# Patient Record
Sex: Female | Born: 1980 | Race: Black or African American | Hispanic: No | Marital: Single | Smoking: Never smoker
Health system: Southern US, Community
[De-identification: ages and names within clinical notes are randomized; demographics above are authoritative.]

## PROBLEM LIST (undated history)

## (undated) ENCOUNTER — Inpatient Hospital Stay (HOSPITAL_COMMUNITY): Payer: Self-pay

## (undated) DIAGNOSIS — K219 Gastro-esophageal reflux disease without esophagitis: Secondary | ICD-10-CM

## (undated) DIAGNOSIS — A539 Syphilis, unspecified: Secondary | ICD-10-CM

## (undated) DIAGNOSIS — B009 Herpesviral infection, unspecified: Secondary | ICD-10-CM

## (undated) DIAGNOSIS — B999 Unspecified infectious disease: Secondary | ICD-10-CM

## (undated) DIAGNOSIS — R1110 Vomiting, unspecified: Secondary | ICD-10-CM

## (undated) HISTORY — PX: TENDON REPAIR: SHX5111

## (undated) HISTORY — PX: WISDOM TOOTH EXTRACTION: SHX21

## (undated) HISTORY — PX: BREAST LUMPECTOMY: SHX2

---
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
## 2000-07-30 ENCOUNTER — Emergency Department (HOSPITAL_COMMUNITY): Admission: EM | Admit: 2000-07-30 | Discharge: 2000-07-30 | Payer: Self-pay | Admitting: Emergency Medicine

## 2000-12-30 ENCOUNTER — Inpatient Hospital Stay (HOSPITAL_COMMUNITY): Admission: AD | Admit: 2000-12-30 | Discharge: 2000-12-30 | Payer: Self-pay | Admitting: *Deleted

## 2001-01-02 ENCOUNTER — Encounter: Payer: Self-pay | Admitting: *Deleted

## 2001-01-02 ENCOUNTER — Inpatient Hospital Stay (HOSPITAL_COMMUNITY): Admission: AD | Admit: 2001-01-02 | Discharge: 2001-01-02 | Payer: Self-pay | Admitting: *Deleted

## 2001-02-04 ENCOUNTER — Inpatient Hospital Stay (HOSPITAL_COMMUNITY): Admission: AD | Admit: 2001-02-04 | Discharge: 2001-02-04 | Payer: Self-pay | Admitting: Obstetrics

## 2001-02-25 ENCOUNTER — Inpatient Hospital Stay (HOSPITAL_COMMUNITY): Admission: AD | Admit: 2001-02-25 | Discharge: 2001-02-25 | Payer: Self-pay | Admitting: Obstetrics

## 2001-07-13 ENCOUNTER — Encounter: Payer: Self-pay | Admitting: Obstetrics

## 2001-07-13 ENCOUNTER — Ambulatory Visit (HOSPITAL_COMMUNITY): Admission: RE | Admit: 2001-07-13 | Discharge: 2001-07-13 | Payer: Self-pay | Admitting: Obstetrics

## 2001-08-16 ENCOUNTER — Inpatient Hospital Stay (HOSPITAL_COMMUNITY): Admission: AD | Admit: 2001-08-16 | Discharge: 2001-08-18 | Payer: Self-pay | Admitting: Obstetrics

## 2002-04-08 ENCOUNTER — Emergency Department (HOSPITAL_COMMUNITY): Admission: EM | Admit: 2002-04-08 | Discharge: 2002-04-08 | Payer: Self-pay | Admitting: Emergency Medicine

## 2002-04-12 ENCOUNTER — Encounter: Payer: Self-pay | Admitting: Obstetrics

## 2002-04-12 ENCOUNTER — Encounter: Admission: RE | Admit: 2002-04-12 | Discharge: 2002-04-12 | Payer: Self-pay | Admitting: Obstetrics

## 2002-07-26 ENCOUNTER — Encounter: Payer: Self-pay | Admitting: Obstetrics

## 2002-07-26 ENCOUNTER — Encounter: Admission: RE | Admit: 2002-07-26 | Discharge: 2002-07-26 | Payer: Self-pay | Admitting: Obstetrics

## 2003-01-24 ENCOUNTER — Encounter: Admission: RE | Admit: 2003-01-24 | Discharge: 2003-01-24 | Payer: Self-pay | Admitting: Family Medicine

## 2003-01-24 ENCOUNTER — Other Ambulatory Visit: Admission: RE | Admit: 2003-01-24 | Discharge: 2003-01-24 | Payer: Self-pay | Admitting: Family Medicine

## 2003-01-31 ENCOUNTER — Encounter: Admission: RE | Admit: 2003-01-31 | Discharge: 2003-01-31 | Payer: Self-pay | Admitting: Sports Medicine

## 2003-01-31 ENCOUNTER — Encounter: Payer: Self-pay | Admitting: Sports Medicine

## 2003-06-09 ENCOUNTER — Emergency Department (HOSPITAL_COMMUNITY): Admission: AD | Admit: 2003-06-09 | Discharge: 2003-06-09 | Payer: Self-pay | Admitting: Family Medicine

## 2003-09-05 ENCOUNTER — Encounter: Admission: RE | Admit: 2003-09-05 | Discharge: 2003-09-05 | Payer: Self-pay | Admitting: Sports Medicine

## 2003-09-10 ENCOUNTER — Encounter: Admission: RE | Admit: 2003-09-10 | Discharge: 2003-09-10 | Payer: Self-pay | Admitting: Family Medicine

## 2003-09-16 ENCOUNTER — Encounter: Admission: RE | Admit: 2003-09-16 | Discharge: 2003-09-16 | Payer: Self-pay | Admitting: Sports Medicine

## 2004-02-15 ENCOUNTER — Emergency Department (HOSPITAL_COMMUNITY): Admission: EM | Admit: 2004-02-15 | Discharge: 2004-02-15 | Payer: Self-pay | Admitting: Emergency Medicine

## 2004-02-17 ENCOUNTER — Ambulatory Visit: Payer: Self-pay | Admitting: Family Medicine

## 2004-03-11 ENCOUNTER — Ambulatory Visit: Payer: Self-pay | Admitting: Family Medicine

## 2004-04-02 ENCOUNTER — Ambulatory Visit: Payer: Self-pay | Admitting: Sports Medicine

## 2004-04-02 ENCOUNTER — Other Ambulatory Visit: Admission: RE | Admit: 2004-04-02 | Discharge: 2004-04-02 | Payer: Self-pay | Admitting: Family Medicine

## 2004-04-22 ENCOUNTER — Ambulatory Visit: Payer: Self-pay | Admitting: Family Medicine

## 2004-07-21 ENCOUNTER — Ambulatory Visit: Payer: Self-pay | Admitting: Family Medicine

## 2004-08-09 ENCOUNTER — Ambulatory Visit: Payer: Self-pay | Admitting: Sports Medicine

## 2004-11-05 ENCOUNTER — Ambulatory Visit: Payer: Self-pay | Admitting: Family Medicine

## 2004-12-17 ENCOUNTER — Ambulatory Visit: Payer: Self-pay | Admitting: Family Medicine

## 2005-01-27 ENCOUNTER — Ambulatory Visit: Payer: Self-pay | Admitting: Family Medicine

## 2005-01-29 ENCOUNTER — Emergency Department (HOSPITAL_COMMUNITY): Admission: EM | Admit: 2005-01-29 | Discharge: 2005-01-29 | Payer: Self-pay | Admitting: Family Medicine

## 2005-02-07 ENCOUNTER — Ambulatory Visit: Payer: Self-pay | Admitting: Sports Medicine

## 2005-02-24 ENCOUNTER — Ambulatory Visit: Payer: Self-pay | Admitting: Family Medicine

## 2005-03-07 ENCOUNTER — Ambulatory Visit: Payer: Self-pay | Admitting: Family Medicine

## 2005-05-04 ENCOUNTER — Ambulatory Visit: Payer: Self-pay | Admitting: Family Medicine

## 2005-05-29 ENCOUNTER — Encounter (INDEPENDENT_AMBULATORY_CARE_PROVIDER_SITE_OTHER): Payer: Self-pay | Admitting: *Deleted

## 2005-06-02 ENCOUNTER — Ambulatory Visit: Payer: Self-pay | Admitting: Family Medicine

## 2005-07-08 ENCOUNTER — Ambulatory Visit: Payer: Self-pay | Admitting: Sports Medicine

## 2005-07-22 ENCOUNTER — Ambulatory Visit: Payer: Self-pay | Admitting: Family Medicine

## 2005-07-27 ENCOUNTER — Ambulatory Visit: Payer: Self-pay | Admitting: Family Medicine

## 2005-07-27 ENCOUNTER — Inpatient Hospital Stay (HOSPITAL_COMMUNITY): Admission: AD | Admit: 2005-07-27 | Discharge: 2005-07-30 | Payer: Self-pay | Admitting: Gynecology

## 2005-07-27 ENCOUNTER — Ambulatory Visit: Payer: Self-pay | Admitting: Gynecology

## 2005-08-04 ENCOUNTER — Ambulatory Visit: Payer: Self-pay | Admitting: Gynecology

## 2005-08-11 ENCOUNTER — Ambulatory Visit: Payer: Self-pay | Admitting: Gynecology

## 2005-08-29 ENCOUNTER — Ambulatory Visit: Payer: Self-pay | Admitting: Family Medicine

## 2005-08-31 ENCOUNTER — Inpatient Hospital Stay (HOSPITAL_COMMUNITY): Admission: AD | Admit: 2005-08-31 | Discharge: 2005-09-01 | Payer: Self-pay | Admitting: Gynecology

## 2005-09-01 ENCOUNTER — Ambulatory Visit: Payer: Self-pay | Admitting: Family Medicine

## 2005-09-01 ENCOUNTER — Inpatient Hospital Stay (HOSPITAL_COMMUNITY): Admission: AD | Admit: 2005-09-01 | Discharge: 2005-09-04 | Payer: Self-pay | Admitting: Obstetrics & Gynecology

## 2005-09-01 ENCOUNTER — Ambulatory Visit: Payer: Self-pay | Admitting: Gynecology

## 2005-09-08 ENCOUNTER — Ambulatory Visit: Payer: Self-pay | Admitting: *Deleted

## 2005-09-11 ENCOUNTER — Ambulatory Visit: Payer: Self-pay | Admitting: Family Medicine

## 2005-09-11 ENCOUNTER — Inpatient Hospital Stay (HOSPITAL_COMMUNITY): Admission: AD | Admit: 2005-09-11 | Discharge: 2005-09-13 | Payer: Self-pay | Admitting: *Deleted

## 2005-09-26 ENCOUNTER — Inpatient Hospital Stay (HOSPITAL_COMMUNITY): Admission: AD | Admit: 2005-09-26 | Discharge: 2005-09-27 | Payer: Self-pay | Admitting: *Deleted

## 2005-09-26 ENCOUNTER — Ambulatory Visit: Payer: Self-pay | Admitting: Certified Nurse Midwife

## 2005-09-29 ENCOUNTER — Ambulatory Visit: Payer: Self-pay | Admitting: Gynecology

## 2005-10-01 ENCOUNTER — Ambulatory Visit: Payer: Self-pay | Admitting: Family Medicine

## 2005-10-01 ENCOUNTER — Inpatient Hospital Stay (HOSPITAL_COMMUNITY): Admission: AD | Admit: 2005-10-01 | Discharge: 2005-10-04 | Payer: Self-pay | Admitting: Family Medicine

## 2005-10-13 ENCOUNTER — Ambulatory Visit (HOSPITAL_COMMUNITY): Admission: RE | Admit: 2005-10-13 | Discharge: 2005-10-13 | Payer: Self-pay | Admitting: *Deleted

## 2005-10-20 ENCOUNTER — Ambulatory Visit: Payer: Self-pay | Admitting: Gynecology

## 2005-10-31 ENCOUNTER — Ambulatory Visit: Payer: Self-pay | Admitting: Family Medicine

## 2005-11-03 ENCOUNTER — Ambulatory Visit: Payer: Self-pay | Admitting: Family Medicine

## 2005-11-17 ENCOUNTER — Ambulatory Visit: Payer: Self-pay | Admitting: Gynecology

## 2005-12-08 ENCOUNTER — Ambulatory Visit: Payer: Self-pay | Admitting: Family Medicine

## 2005-12-15 ENCOUNTER — Ambulatory Visit: Payer: Self-pay | Admitting: Gynecology

## 2005-12-15 ENCOUNTER — Ambulatory Visit (HOSPITAL_COMMUNITY): Admission: RE | Admit: 2005-12-15 | Discharge: 2005-12-15 | Payer: Self-pay | Admitting: Obstetrics & Gynecology

## 2005-12-19 ENCOUNTER — Ambulatory Visit: Payer: Self-pay | Admitting: Gynecology

## 2005-12-29 ENCOUNTER — Ambulatory Visit: Payer: Self-pay | Admitting: Family Medicine

## 2006-01-12 ENCOUNTER — Ambulatory Visit: Payer: Self-pay | Admitting: Gynecology

## 2006-01-19 ENCOUNTER — Ambulatory Visit: Payer: Self-pay | Admitting: Family Medicine

## 2006-01-26 ENCOUNTER — Inpatient Hospital Stay (HOSPITAL_COMMUNITY): Admission: AD | Admit: 2006-01-26 | Discharge: 2006-01-27 | Payer: Self-pay | Admitting: Obstetrics & Gynecology

## 2006-01-27 ENCOUNTER — Ambulatory Visit: Payer: Self-pay | Admitting: Obstetrics and Gynecology

## 2006-01-27 ENCOUNTER — Inpatient Hospital Stay (HOSPITAL_COMMUNITY): Admission: AD | Admit: 2006-01-27 | Discharge: 2006-01-27 | Payer: Self-pay | Admitting: Obstetrics & Gynecology

## 2006-01-31 ENCOUNTER — Ambulatory Visit: Payer: Self-pay | Admitting: Obstetrics and Gynecology

## 2006-01-31 ENCOUNTER — Encounter (INDEPENDENT_AMBULATORY_CARE_PROVIDER_SITE_OTHER): Payer: Self-pay | Admitting: Specialist

## 2006-01-31 ENCOUNTER — Inpatient Hospital Stay (HOSPITAL_COMMUNITY): Admission: AD | Admit: 2006-01-31 | Discharge: 2006-02-03 | Payer: Self-pay | Admitting: Obstetrics and Gynecology

## 2006-03-03 ENCOUNTER — Ambulatory Visit: Payer: Self-pay | Admitting: Family Medicine

## 2006-03-24 ENCOUNTER — Ambulatory Visit: Payer: Self-pay | Admitting: Family Medicine

## 2006-05-05 ENCOUNTER — Ambulatory Visit: Payer: Self-pay | Admitting: Sports Medicine

## 2006-07-20 DIAGNOSIS — A539 Syphilis, unspecified: Secondary | ICD-10-CM

## 2006-07-20 HISTORY — DX: Syphilis, unspecified: A53.9

## 2006-07-21 ENCOUNTER — Encounter (INDEPENDENT_AMBULATORY_CARE_PROVIDER_SITE_OTHER): Payer: Self-pay | Admitting: *Deleted

## 2006-09-01 ENCOUNTER — Ambulatory Visit: Payer: Self-pay | Admitting: Family Medicine

## 2006-09-01 ENCOUNTER — Encounter (INDEPENDENT_AMBULATORY_CARE_PROVIDER_SITE_OTHER): Payer: Self-pay | Admitting: Family Medicine

## 2006-09-01 DIAGNOSIS — E669 Obesity, unspecified: Secondary | ICD-10-CM | POA: Insufficient documentation

## 2006-09-01 LAB — CONVERTED CEMR LAB
Albumin: 4.7 g/dL (ref 3.5–5.2)
BUN: 13 mg/dL (ref 6–23)
CO2: 24 meq/L (ref 19–32)
Chlamydia, DNA Probe: NEGATIVE
Chloride: 103 meq/L (ref 96–112)
Cholesterol: 141 mg/dL (ref 0–200)
GC Probe Amp, Genital: NEGATIVE
LDL Cholesterol: 74 mg/dL (ref 0–99)
Potassium: 4.3 meq/L (ref 3.5–5.3)
Total Bilirubin: 1.3 mg/dL — ABNORMAL HIGH (ref 0.3–1.2)
Total Protein: 7.9 g/dL (ref 6.0–8.3)
VLDL: 14 mg/dL (ref 0–40)

## 2006-09-03 ENCOUNTER — Encounter (INDEPENDENT_AMBULATORY_CARE_PROVIDER_SITE_OTHER): Payer: Self-pay | Admitting: Family Medicine

## 2006-10-30 ENCOUNTER — Telehealth: Payer: Self-pay | Admitting: *Deleted

## 2006-10-31 ENCOUNTER — Ambulatory Visit: Payer: Self-pay | Admitting: Family Medicine

## 2006-10-31 LAB — CONVERTED CEMR LAB: Rapid Strep: NEGATIVE

## 2007-02-26 ENCOUNTER — Telehealth: Payer: Self-pay | Admitting: *Deleted

## 2007-03-02 ENCOUNTER — Telehealth (INDEPENDENT_AMBULATORY_CARE_PROVIDER_SITE_OTHER): Payer: Self-pay | Admitting: *Deleted

## 2007-03-02 ENCOUNTER — Ambulatory Visit: Payer: Self-pay | Admitting: Family Medicine

## 2007-03-02 DIAGNOSIS — R300 Dysuria: Secondary | ICD-10-CM | POA: Insufficient documentation

## 2007-03-02 DIAGNOSIS — N760 Acute vaginitis: Secondary | ICD-10-CM | POA: Insufficient documentation

## 2007-03-02 LAB — CONVERTED CEMR LAB
Ketones, urine, test strip: NEGATIVE
Nitrite: NEGATIVE
Protein, U semiquant: NEGATIVE
Specific Gravity, Urine: >=1.03
Whiff Test: POSITIVE
pH: 6

## 2007-03-05 ENCOUNTER — Telehealth: Payer: Self-pay | Admitting: *Deleted

## 2007-03-05 ENCOUNTER — Ambulatory Visit: Payer: Self-pay | Admitting: Family Medicine

## 2007-03-20 ENCOUNTER — Encounter (INDEPENDENT_AMBULATORY_CARE_PROVIDER_SITE_OTHER): Payer: Self-pay | Admitting: *Deleted

## 2007-07-03 ENCOUNTER — Telehealth: Payer: Self-pay | Admitting: *Deleted

## 2007-07-03 ENCOUNTER — Ambulatory Visit: Payer: Self-pay | Admitting: Family Medicine

## 2007-07-03 DIAGNOSIS — A6000 Herpesviral infection of urogenital system, unspecified: Secondary | ICD-10-CM | POA: Insufficient documentation

## 2007-07-04 ENCOUNTER — Encounter (INDEPENDENT_AMBULATORY_CARE_PROVIDER_SITE_OTHER): Payer: Self-pay | Admitting: Family Medicine

## 2007-07-04 ENCOUNTER — Ambulatory Visit: Payer: Self-pay | Admitting: Family Medicine

## 2007-07-04 DIAGNOSIS — J069 Acute upper respiratory infection, unspecified: Secondary | ICD-10-CM | POA: Insufficient documentation

## 2007-07-19 ENCOUNTER — Telehealth: Payer: Self-pay | Admitting: *Deleted

## 2007-07-25 ENCOUNTER — Encounter (INDEPENDENT_AMBULATORY_CARE_PROVIDER_SITE_OTHER): Payer: Self-pay | Admitting: Family Medicine

## 2007-07-25 ENCOUNTER — Ambulatory Visit: Payer: Self-pay | Admitting: Family Medicine

## 2007-07-25 DIAGNOSIS — N898 Other specified noninflammatory disorders of vagina: Secondary | ICD-10-CM | POA: Insufficient documentation

## 2007-07-25 LAB — CONVERTED CEMR LAB
Chlamydia, DNA Probe: NEGATIVE
Whiff Test: POSITIVE

## 2007-08-16 ENCOUNTER — Encounter: Payer: Self-pay | Admitting: *Deleted

## 2007-09-12 ENCOUNTER — Encounter (INDEPENDENT_AMBULATORY_CARE_PROVIDER_SITE_OTHER): Payer: Self-pay | Admitting: Family Medicine

## 2008-05-02 ENCOUNTER — Other Ambulatory Visit: Admission: RE | Admit: 2008-05-02 | Discharge: 2008-05-02 | Payer: Self-pay | Admitting: Family Medicine

## 2010-01-08 ENCOUNTER — Emergency Department (HOSPITAL_COMMUNITY): Admission: EM | Admit: 2010-01-08 | Discharge: 2010-01-08 | Payer: Self-pay | Admitting: Emergency Medicine

## 2010-10-08 NOTE — Discharge Summary (Signed)
[name], [name]              ACCOUNT NO.:  [id number]   MEDICAL RECORD NO.:  [id number]          PATIENT TYPE:  INP   LOCATION:                                [facility]   PHYSICIAN:  Angie B. Merlene Morse, MD  DATE OF BIRTH:  Dec 06, 1980   DATE OF ADMISSION:  09/08/2005  DATE OF DISCHARGE:  09/13/2005                                 DISCHARGE SUMMARY   ADMITTING DIAGNOSES:  1.  14-week intrauterine pregnancy.  2.  Hyperemesis.  3.  Weight loss.  4.  Dehydration.   DISCHARGE DIAGNOSES:  1.  Hyperemesis, stable.  2.  Dehydration, resolved.   ADMITTING HISTORY AND PHYSICAL:  The patient is a 30 year old, G4, P1-0-2-1,  at 13-6/7 weeks by LMP consistent with a 7-week ultrasound well known to our  service with hyperemesis. At the time of her last discharge she tolerated  p.o. for three days. The discharge was on September 04, 2005, but since she has  got increasingly worse. She had home health arranged and home health noted a  5-pound weight loss since discharge. She had also been treated for a UTI at  her previous admission and had been given Macrobid for four days following  her discharge and was only able to tolerate it for three days.   PHYSICAL EXAMINATION:  Her temperature was 97.9, pulse 101, respiratory rate  20, blood pressure 121/78. On physical exam, generally a well-developed,  well-nourished female in no acute distress. Cardiovascular revealed normal  S1 and S2; no murmurs, rubs, or gallops. Chest clear to auscultation  bilaterally. Abdomen gravid with no CVA tenderness.   HOSPITAL COURSE:  The patient was admitted. She was given IV fluid  rehydration. She was started on cefotetan for her UTI. She was given  Phenergan, Zofran, methylprednisolone, Reglan, and Pepcid. The patient  improved and was tolerating a regular bland diet at the time of discharge.  Significant laboratories included sodium of 137, potassium 3.6, chloride  105, CO2 25, BUN 3, creatinine 0.5,  glucose 82, AST 15, ALT 10. UA revealed  specific gravity  greater than 1.030, moderate hemoglobin, small bilirubin,  greater than 80 ketones, 100 total protein, large leukocyte esterase (that  was a clean catch). She had a repeat urine done which was cath which showed  a moderate LE. Urine culture is pending at the time of discharge. Wet prep  revealed yeast, GC, and Chlamydia negative. The patient was given Diflucan  for her yeast infection before discharge.   DISCHARGE INSTRUCTIONS:  The patient is discharged home. She is to follow up  with high risk clinic as scheduled.   DISCHARGE MEDICATIONS:  1.  Ceftin 500 mg one p.o. b.i.d., #10, with no refills.  2.  Methylprednisolone to take as directed over a two-week taper, 4 mg      tablet dispensed with 77 given with no refills. The patient was given a      copy of the taper regimen.   The patient is instructed to follow a bland diet and home health was  arranged to continue to follow the patient  at home.           ______________________________  August Saucer. Merlene Morse, MD     ABC/MEDQ  D:  09/13/2005  T:  09/13/2005  Job:  [id number]

## 2010-10-08 NOTE — Discharge Summary (Signed)
NAMEMONZERAT, HANDLER              ACCOUNT NO.:  [id number]   MEDICAL RECORD NO.:  [id number]          PATIENT TYPE:  INP   LOCATION:                                [facility]   PHYSICIAN:  Angie B. Merlene Morse, MD  DATE OF BIRTH:  1980/09/29   DATE OF ADMISSION:  09/29/2005  DATE OF DISCHARGE:  10/04/2005                                 DISCHARGE SUMMARY   ADMITTING DIAGNOSES:  1.  A 16 and 5/7 weeks intrauterine pregnancy.  2.  Hyperemesis.  3.  Dehydration   DISCHARGE DIAGNOSES:  1.  A 16 and 5/7 weeks intrauterine pregnancy.  2.  Hyperemesis.  3.  Dehydration.   ADMITTING ATTENDING:  Dr. Shawnie Pons.   DISCHARGE ATTENDING:  Dr. Okey Dupre.   CONSULTS:  Dietary.   ADMITTING HISTORY AND PHYSICAL:  The patient is a 30 year old, G4, P1-0-3-2-  1 at 82 and 5/7 weeks with a history of hyperemesis admitted 3 times this  pregnancy for hyperemesis who reports with nausea and vomiting, and weight  loss.  The weight loss was reported by the home health nurse and she was  unable to get an IV access.  She had lost 4 pounds since May 10.   OBSTETRICAL HISTORY:  SVD x1 and TAB x1.   ALLERGIES:  NO KNOWN DRUG ALLERGIES.   GYNECOLOGICAL HISTORY:  History of Chlamydia, Trichomonas, syphilis and  herpes.   MEDICAL HISTORY:  None.   SURGERIES:  Lumpectomy of right breast and tendon repair.   PHYSICAL EXAMINATION:  VITAL SIGNS:  Temp 90 76, pulse 114, respiratory rate  is 20 and blood pressure 120/82.  GENERAL:  The patient is tearing, does not feel well and weak.   LABORATORY ON ADMISSION:  Specific gravity ranging 1.030 with ketones  greater than 80 in her UA.   HOSPITAL COURSE:  The patient was admitted.  She was given IV fluids and  dietary was consulted to help with management.  I suggested 3 snacks a day  in addition to her meals.  The patient was tolerating a brat diet at that  time of the discharge.  The patient was also started on Reglan, Phenergan  and Benadryl every 6 hours, as  well as Pepcid every 12 hours.  At the time  of discharge, the patient was tolerating p.o., looked much better and stated  that she felt much better.   LABORATORY:  Prealbumin 7.4, AST 15, ALT 10, BUN 3 and, creatinine 0.8. CBC:  WBC 6.2, hemoglobin 13.8, and platelets 205.   DISCHARGE INSTRUCTIONS:  The patient was discharged to home.  She is to  follow up a scheduled at the high risk clinic on May 31.  She was to  continue with home health.  She was to take medicines.  Pepcid 20 mg twice a  day, Phenergan 25 mg 1 every 6 hours, Benadryl 25 mg every 6 hours and  Reglan 10 mg every 6 hours.  The patient was encouraged to eat small amounts  of food throughout the day and sip on fluids throughout the day.  Home  health would be  arranged to continue after discharge.           ______________________________  August Saucer. Merlene Morse, MD     ABC/MEDQ  D:  10/04/2005  T:  10/04/2005  Job:  [id number]

## 2010-10-08 NOTE — Discharge Summary (Signed)
[name], [name]              ACCOUNT NO.:  [id number]   MEDICAL RECORD NO.:  [id number]          PATIENT TYPE:  INP   LOCATION:  9131                          [facility]ILITY:  [facility]   PHYSICIAN:  Phil D. Okey Dupre, M.D.     DATE OF BIRTH:  01/23/1981   DATE OF ADMISSION:  01/31/2006  DATE OF DISCHARGE:  02/03/2006                                 DISCHARGE SUMMARY   HISTORY OF PRESENT ILLNESS:  This is a 30 year old gravida 4, para 1-0-2-1  who was admitted at 34-1/7th weeks with dehydration.  The patient had been  followed at [facility] by Barth Kirks, M.D.  It  was noted that she had hyperemesis during the whole pregnancy with poor  weight gain.  The patient was admitted on January 31, 2006 and was found  to have a nonreactive fetal heart rate tracing and fetal tachycardia.  She  did undergo an oxytocin challenge test which was positive and it was decided  by Dr. Javier Glazier. Rose that she was to undergo a cesarean section for  nonreassuring fetal heart rate pattern.   HOSPITAL COURSE:  The patient did undergo a primary low transverse cesarean  section and was delivered of a female.  Weight was 3 pounds 15.3 ounces.  Apgar's were 6 and 9.  The patient's baby did go to the neonatal ICU.  Estimated blood loss was 400 mL.  Primary surgeon, Elinor Dodge, M.D.  Assistant Darletta Moll, M.D.   The patient had unremarkable hospital course and on postoperative day #3,  she remained afebrile.  She was eating, voiding and ambulating without  difficulty and discharged home in good condition.  Her infant did remain in  neonatal ICU.   DISCHARGE INSTRUCTIONS:  No driving or heavy lifting for two weeks.  Nothing  in the vagina for six weeks.  She is to call for a temperature of greater  than 100.2 or greater.  Her staples were removed before discharge.  Her  Rubella status was noted to be immuned.  Her blood type was A positive.   FOLLOW UP:  Note to return to the office in six  weeks.   DISCHARGE MEDICATIONS:  1. Vicodin p.r.n. for pain.  2. Ibuprofen 600 mg p.r.n. for pain.  3. Prenatal vitamin 1 p.o. daily while nursing or at least six weeks.  4. Colace 100 mg p.o. b.i.d.   FINAL DIAGNOSES:  1. A 34-1/7th week preterm pregnancy delivered.  2. Nonreassuring fetal heart rate.  3. Status post primary low transverse cesarean section.     ______________________________  Jeb Levering, C.N.M.    ______________________________  Javier Glazier. Okey Dupre, M.D.    [place]/MEDQ  D:  03/25/2006  T:  03/25/2006  Job:  [id number]

## 2010-10-08 NOTE — Op Note (Signed)
[name], [name]              ACCOUNT NO.:  [id number]   MEDICAL RECORD NO.:  [id number]          PATIENT TYPE:  INP   LOCATION:  9131                          [facility]   PHYSICIAN:  Phil D. Okey Dupre, M.D.     DATE OF BIRTH:  [birth year]   DATE OF PROCEDURE:  01/31/2006  DATE OF DISCHARGE:                                 OPERATIVE REPORT   PROCEDURE:  Low transverse cesarean section.   PREOPERATIVE DIAGNOSIS:  Nonreassuring fetal heart pattern with intractable  hyperemesis gravidarum.   POSTOPERATIVE DIAGNOSIS:  Nonreassuring fetal heart pattern with intractable  hyperemesis gravidarum.   SURGEON:  Javier Glazier. Okey Dupre, M.D.   ANESTHESIA:  Spinal.   SPECIMENS TO PATHOLOGY:  Placenta.   POSTOPERATIVE CONDITION:  Satisfactory.   ESTIMATED BLOOD LOSS:  400 mL.   REASON FOR CESAREAN SECTION:  The patient, a 30 year old gravida 4, para 1-0-  2-1, with many hospital visits and admissions for hyperemesis gravidarum  throughout this pregnancy, similar to what she had with the last pregnancy.  This evening when she came in with severe vomiting having lasted several  days with large ketones in the urine and a specific gravity of the urine at  1.030, a hematocrit of 45, we felt we had evidence of dehydration as well as  the hyperemesis.  That we corrected initially as well as we could.  During  this entire time there was very little fetal reactivity on the fetal heart  monitor and the fetus was having a tachycardia from the 160 to 180 with  variable decelerations.  We watched for several hours, this did not change,  and finally decided to do an oxytocin challenge test, which was positive.  We decided to proceed with cesarean section as follows.   OPERATIVE PROCEDURE:  Under satisfactory spinal anesthesia with the patient  in the dorsal supine position with Foley catheter in the urinary bladder,  the abdomen was prepped and draped in the usual sterile manner.  Entered  through a  transverse suprapubic incision situated 3 cm above the symphysis  pubis and extending for a total length of 16 cm.  The abdomen was entered by  layers.  On entering the peritoneal cavity, the visceral peritoneum and the  anterior surface of the uterus was opened transversely by sharp dissection  and the bladder pushed away from the lower uterine segment, which was  entered by sharp and blunt dissection.  From ROT presentation, the baby was  quite easily delivered, the cord doubly clamped, divided, the baby handed to  the pediatrician.  Specimens of blood were taken from the cord for analysis.  The placenta was spontaneously removed, the uterus explored and closed with  continuous running locked 0 Vicryl on an atraumatic needle.  The area was  observed for bleeding and none was noted.  The pelvis was irrigated with  normal saline.  The fascia was closed with a continuous running 0 Vicryl  suture on an atraumatic needle.  Subcutaneous bleeders controlled with hot  cautery.  The subcutaneous layer was irrigated with normal saline and skin  staples used  for skin edge approximation.  Dry sterile dressing was applied.  Total blood  loss during the procedure approximately 400 mL.  The patient tolerated the  procedure well, was transferred to the recovery room in satisfactory  condition.  Tape, instrument, sponge and needle count were reported correct  at the end of the procedure, and the Foley catheter was draining clear amber  urine.           ______________________________  Javier Glazier Okey Dupre, M.D.     PDR/MEDQ  D:  01/31/2006  T:  02/01/2006  Job:  [id number]

## 2010-10-08 NOTE — Discharge Summary (Signed)
[name], [name]              ACCOUNT NO.:  [id number]   MEDICAL RECORD NO.:  [id number]          PATIENT TYPE:  INP   LOCATION:  9305                          [facility]   PHYSICIAN:  Angie B. Merlene Morse, MD  DATE OF BIRTH:  [birth year]   DATE OF ADMISSION:  07/27/2005  DATE OF DISCHARGE:  07/30/2005                                 DISCHARGE SUMMARY   ADMITTING ATTENDING:  Dr. Mia Creek.   DISCHARGING ATTENDING:  Dr. Okey Dupre.   ADMITTING DIAGNOSES:  1.  Hyperemesis.  2.  7-week intrauterine pregnancy.  3.  Latent syphilis.  4.  Dehydration.  5.  Hypokalemia.   DISCHARGE DIAGNOSIS:  1.  Hyperemesis.  2.  7-week intrauterine pregnancy.  3.  Latent syphilis.  4.  Dehydration.  5.  Hypokalemia.   ADMITTING HISTORY AND PHYSICAL:  The patient is a 30 year old G38, P1-0-2-1  at 7 weeks and 2 days estimated gestational age by LMP consistent with  ultrasound, who was brought in today with greater than 2 hours history of  nausea, vomiting and unable to tolerate liquids.  She also has a history of  the diagnosis of syphilis in November 2005, treated and her titer was 1:2 in  November 2006.  Her titer now is 1:16.  The patient was orthostatic in the  clinic and had a weight loss of 5.8 pounds.   HOSPITAL COURSE:  The patient was admitted to the hospital.  She was  rehydrated with IV fluids.  She received Phenergan and Zofran as needed for  nausea and vomiting.  She was started on a steroid methyl prednisolone IV.  She was given penicillin 2.4 million units IM x1 dose, which she had  repeated q. weekly for a total of 3 doses.  Her hypokalemia was treated with  potassium and her potassium equilibrated.  The patient was given Flagyl.  While here, she was found to have BV on March 2.   SIGNIFICANT LABORATORY:  H. pylori negative.  Hepatitis A and IgM negative.  TSH low, but free T4 normal and free T3 2.2, lower end of normal is 2.3.   DISCHARGE INSTRUCTIONS:  The patient was  discharged to home in stable  condition.  She was to follow a bland diet.   MEDICATIONS:  1.  Phenergan 25 mg p.o. #60 with 2 refills.  2.  Phenergan 25 mg PR #10 with 2 refills.  3.  Zofran 8 mg #30 with 2 refills.  4.  Methyl prednisolone taper 4 mg #27 as directed.  The taper it is on      March 10 she is taking 2 tablets two times a day.  March 11:  2 tablets      in the morning, 1 at lunch and 2 at bedtime.  March 12:  2 tablets in      the morning, 1 at lunch and 1 at bedtime.  March 13:  2 in the morning      and 1 at lunch.  March 14:  2 in the morning and 1 at lunch.  March 15:  2 in the morning.  March 16:  2 in the morning.  March 17:  1 in the      morning.  March 18:  1 in the morning.   The patient is to return to the high risk clinic on March 15 at 9:15.  At  that point, she should get her second dose of benzathine penicillin 2.4  million units and she should get another dose 1 week later.           ______________________________  August Saucer. Merlene Morse, MD     ABC/MEDQ  D:  07/30/2005  T:  07/31/2005  Job:  [id number]   cc:   [facility]  [place], [place] [address]

## 2010-10-08 NOTE — Discharge Summary (Signed)
NAMEJAZZELLE, ZHANG              ACCOUNT NO.:  [id number]   MEDICAL RECORD NO.:  [id number]          PATIENT TYPE:  INP   LOCATION:  9314                          [facility]   PHYSICIAN:  Lesly Dukes, M.D. DATE OF BIRTH:  [birth year]   DATE OF ADMISSION:  09/01/2005  DATE OF DISCHARGE:  09/04/2005                                 DISCHARGE SUMMARY   ADMISSION DIAGNOSIS:  Hyperemesis at 12-2/7-week intrauterine pregnancy.   DISCHARGE DIAGNOSES:  1.  Hyperemesis, improved, at 12-5/7-week viable intrauterine pregnancy.  2.  Asymptomatic bacteruria.   HISTORY OF PRESENT ILLNESS:  The patient is a 30 year old G18, P1-0-2-1, who  presented giving a 3-week history of nausea and vomiting and has been unable  to tolerate any liquids for several hours prior to admission.  Of note, she  was admitted July 27, 2005 through the 10th also with hyperemesis at 7  weeks.  It was also noted then that she had latent syphilis, which was  treated.   SIGNIFICANT LABORATORY DATA:  Sodium 135, potassium 3.1, BUN 3, creatinine  0.6, glucose 112.  LFTs normal.  Urine culture:  E. coli 40,000 on September 01, 2005.   HOSPITAL COURSE:  The patient was started on IV steroids as well as Zofran  and Phenergan on September 01, 2005.  By September 03, 2005, she was tolerating  solid foods and had not vomited for over 24 hours.  On September 04, 2005, she  was discharged home at 12-5/7 weeks with hyperemesis improved, status post  latent syphilis treated, E. coli symptomatic bacteriuria.   DISCHARGE PLANS:  Her discharge plans includes home health visit on September 06, 2005, which had already been arranged, and she is to follow up at the  [facility] in 1 week.  Medications:  Macrobid for 4 further days.  She  was given prescriptions for Zofran and Phenergan p.o. as well as a work  excuse until she is seen at the [facility] next week.      Deirdre Christy Gentles, C.N.M.    ______________________________  Lesly Dukes, M.D.    DP/MEDQ  D:  09/04/2005  T:  09/05/2005  Job:  [id number]

## 2013-01-23 ENCOUNTER — Ambulatory Visit (INDEPENDENT_AMBULATORY_CARE_PROVIDER_SITE_OTHER): Payer: 59 | Admitting: Obstetrics

## 2013-01-23 ENCOUNTER — Encounter: Payer: Self-pay | Admitting: Obstetrics

## 2013-01-23 VITALS — BP 122/84 | Temp 98.3°F | Wt 191.0 lb

## 2013-01-23 DIAGNOSIS — Z3481 Encounter for supervision of other normal pregnancy, first trimester: Secondary | ICD-10-CM

## 2013-01-23 DIAGNOSIS — N898 Other specified noninflammatory disorders of vagina: Secondary | ICD-10-CM

## 2013-01-23 DIAGNOSIS — Z3201 Encounter for pregnancy test, result positive: Secondary | ICD-10-CM

## 2013-01-23 DIAGNOSIS — A6000 Herpesviral infection of urogenital system, unspecified: Secondary | ICD-10-CM

## 2013-01-23 MED ORDER — VALACYCLOVIR HCL 1 G PO TABS: 1000.0000 mg | ORAL_TABLET | Freq: Two times a day (BID) | ORAL | Status: DC

## 2013-01-23 NOTE — Progress Notes (Signed)
P-88   Subjective:    [name] is being seen today for her first obstetrical visit.  This is a planned pregnancy. She is at [redacted]w[redacted]d gestation. Her obstetrical history is significant for preterm delivery. Relationship with FOB: significant other, living together. Patient does intend to breast feed. Pregnancy history fully reviewed.  Menstrual History: OB History   Grav Para Term Preterm Abortions TAB SAB Ect Mult Living   6 2 1 1 3 3    2       Last Pap: 2014 Results were normal.  Menarche age: 25 Regular  Patient's last menstrual period was 12/19/2012.    The following portions of the patient's history were reviewed and updated as appropriate: allergies, current medications, past family history, past medical history, past social history, past surgical history and problem list.  Review of Systems Pertinent items are noted in HPI.    Objective:    General appearance: alert and no distress Abdomen: normal findings: soft, non-tender Pelvic: cervix normal in appearance, external genitalia normal, no adnexal masses or tenderness, no cervical motion tenderness, uterus normal size, shape, and consistency and vagina normal without discharge Extremities: extremities normal, atraumatic, no cyanosis or edema    Assessment:    Pregnancy at [redacted]w[redacted]d weeks    Plan:    Initial labs drawn. Prenatal vitamins.  Counseling provided regarding continued use of seat belts, cessation of alcohol consumption, smoking or use of illicit drugs; infection precautions i.e., influenza/TDAP immunizations, toxoplasmosis,CMV, parvovirus, listeria and varicella; workplace safety, exercise during pregnancy; routine dental care, safe medications, sexual activity, hot tubs, saunas, pools, travel, caffeine use, fish and methlymercury, potential toxins, hair treatments, varicose veins Weight gain recommendations reviewed: underweight/BMI< 18.5--> gain 28 - 40 lbs; normal weight/BMI 18.5 - 24.9--> gain 25 - 35 lbs;  overweight/BMI 25 - 29.9--> gain 15 - 25 lbs; obese/BMI >30->gain  11 - 20 lbs Problem list reviewed and updated. AFP3 discussed: Done?. Role of ultrasound in pregnancy discussed; fetal survey: Done?Marland Kitchen Amniocentesis discussed: not indicated. Follow up in 2 weeks. 50% of 20 min visit spent on counseling and coordination of care.

## 2013-01-24 ENCOUNTER — Encounter: Payer: Self-pay | Admitting: Obstetrics

## 2013-01-24 LAB — GC/CHLAMYDIA PROBE AMP: CT Probe RNA: NEGATIVE

## 2013-01-24 LAB — WET PREP BY MOLECULAR PROBE
Gardnerella vaginalis: NEGATIVE
Trichomonas vaginosis: NEGATIVE

## 2013-01-24 LAB — CULTURE, OB URINE: Colony Count: 5000

## 2013-01-30 ENCOUNTER — Encounter: Payer: Self-pay | Admitting: Obstetrics

## 2013-01-30 ENCOUNTER — Ambulatory Visit (INDEPENDENT_AMBULATORY_CARE_PROVIDER_SITE_OTHER): Payer: 59 | Admitting: Obstetrics

## 2013-01-30 VITALS — BP 120/86 | Temp 98.3°F | Wt 193.6 lb

## 2013-01-30 DIAGNOSIS — Z3481 Encounter for supervision of other normal pregnancy, first trimester: Secondary | ICD-10-CM

## 2013-01-30 DIAGNOSIS — Z3480 Encounter for supervision of other normal pregnancy, unspecified trimester: Secondary | ICD-10-CM

## 2013-01-30 DIAGNOSIS — O219 Vomiting of pregnancy, unspecified: Secondary | ICD-10-CM | POA: Insufficient documentation

## 2013-01-30 DIAGNOSIS — O210 Mild hyperemesis gravidarum: Secondary | ICD-10-CM

## 2013-01-30 LAB — POCT URINALYSIS DIPSTICK
Bilirubin, UA: NEGATIVE
Glucose, UA: NEGATIVE
Leukocytes, UA: NEGATIVE
Nitrite, UA: NEGATIVE
Spec Grav, UA: 1.02

## 2013-01-30 MED ORDER — MECLIZINE HCL 25 MG PO CHEW: CHEWABLE_TABLET | ORAL | Status: DC

## 2013-01-30 NOTE — Progress Notes (Signed)
Pulse: 91

## 2013-01-31 ENCOUNTER — Encounter: Payer: Self-pay | Admitting: Obstetrics

## 2013-02-15 ENCOUNTER — Encounter: Payer: Self-pay | Admitting: Advanced Practice Midwife

## 2013-02-27 ENCOUNTER — Encounter: Payer: Self-pay | Admitting: Obstetrics

## 2013-02-27 ENCOUNTER — Ambulatory Visit (INDEPENDENT_AMBULATORY_CARE_PROVIDER_SITE_OTHER): Payer: 59 | Admitting: Obstetrics

## 2013-02-27 VITALS — BP 120/82 | Temp 97.4°F | Wt 202.0 lb

## 2013-02-27 DIAGNOSIS — Z3480 Encounter for supervision of other normal pregnancy, unspecified trimester: Secondary | ICD-10-CM

## 2013-02-27 LAB — POCT URINALYSIS DIPSTICK
Bilirubin, UA: NEGATIVE
Blood, UA: NEGATIVE
Ketones, UA: NEGATIVE
Nitrite, UA: NEGATIVE
Protein, UA: NEGATIVE
Spec Grav, UA: 1.02
Urobilinogen, UA: NEGATIVE

## 2013-02-27 NOTE — Progress Notes (Signed)
Pulse: 85

## 2013-02-28 LAB — OBSTETRIC PANEL
Antibody Screen: NEGATIVE
Basophils Relative: 0 % (ref 0–1)
Eosinophils Absolute: 0.2 10*3/uL (ref 0.0–0.7)
Eosinophils Relative: 3 % (ref 0–5)
Lymphocytes Relative: 28 % (ref 12–46)
Lymphs Abs: 2.1 10*3/uL (ref 0.7–4.0)
MCH: 25.9 pg — ABNORMAL LOW (ref 26.0–34.0)
MCHC: 32.9 g/dL (ref 30.0–36.0)
Neutro Abs: 4.5 10*3/uL (ref 1.7–7.7)
Rh Type: POSITIVE
WBC: 7.5 10*3/uL (ref 4.0–10.5)

## 2013-02-28 LAB — HIV ANTIBODY (ROUTINE TESTING W REFLEX): HIV: NONREACTIVE

## 2013-02-28 LAB — VITAMIN D 25 HYDROXY (VIT D DEFICIENCY, FRACTURES): Vit D, 25-Hydroxy: 29 ng/mL — ABNORMAL LOW (ref 30–89)

## 2013-03-01 LAB — HEMOGLOBINOPATHY EVALUATION
Hgb A2 Quant: 2.2 % (ref 2.2–3.2)
Hgb A: 97.8 % (ref 96.8–97.8)

## 2013-03-01 LAB — CULTURE, OB URINE: Colony Count: NO GROWTH

## 2013-03-13 ENCOUNTER — Ambulatory Visit (INDEPENDENT_AMBULATORY_CARE_PROVIDER_SITE_OTHER): Payer: 59 | Admitting: Obstetrics

## 2013-03-13 VITALS — BP 122/85 | Temp 97.9°F | Wt 204.4 lb

## 2013-03-13 DIAGNOSIS — Z3481 Encounter for supervision of other normal pregnancy, first trimester: Secondary | ICD-10-CM

## 2013-03-13 DIAGNOSIS — Z3480 Encounter for supervision of other normal pregnancy, unspecified trimester: Secondary | ICD-10-CM

## 2013-03-13 LAB — POCT URINALYSIS DIPSTICK
Bilirubin, UA: NEGATIVE
Ketones, UA: NEGATIVE
Leukocytes, UA: NEGATIVE
Protein, UA: NEGATIVE
Spec Grav, UA: 1.025
Urobilinogen, UA: NEGATIVE

## 2013-03-13 NOTE — Progress Notes (Signed)
Pulse: 93

## 2013-04-11 ENCOUNTER — Ambulatory Visit (INDEPENDENT_AMBULATORY_CARE_PROVIDER_SITE_OTHER): Payer: 59 | Admitting: Obstetrics

## 2013-04-11 ENCOUNTER — Encounter: Payer: Self-pay | Admitting: Obstetrics

## 2013-04-11 VITALS — BP 128/85 | Temp 97.5°F | Wt 214.0 lb

## 2013-04-11 DIAGNOSIS — Z1389 Encounter for screening for other disorder: Secondary | ICD-10-CM

## 2013-04-11 DIAGNOSIS — Z3480 Encounter for supervision of other normal pregnancy, unspecified trimester: Secondary | ICD-10-CM

## 2013-04-11 DIAGNOSIS — Z3482 Encounter for supervision of other normal pregnancy, second trimester: Secondary | ICD-10-CM

## 2013-04-11 LAB — POCT URINALYSIS DIPSTICK
Leukocytes, UA: NEGATIVE
Nitrite, UA: NEGATIVE
Urobilinogen, UA: NEGATIVE
pH, UA: 5

## 2013-04-11 NOTE — Progress Notes (Signed)
Pulse 90   Pt states that pregnancy is going well.

## 2013-04-12 LAB — AFP, QUAD SCREEN
AFP: 34.9 IU/mL
Age Alone: 1:538 {titer}
Curr Gest Age: 16.1 wks.days
INH: 77.1 pg/mL
MoM for AFP: 1.28
MoM for INH: 0.49
Osb Risk: 1:10900 {titer}
Trisomy 18 (Edward) Syndrome Interp.: 1:6580 {titer}
uE3 Value: 0.3 ng/mL

## 2013-05-07 ENCOUNTER — Ambulatory Visit (INDEPENDENT_AMBULATORY_CARE_PROVIDER_SITE_OTHER): Payer: 59 | Admitting: Obstetrics

## 2013-05-07 ENCOUNTER — Encounter: Payer: Self-pay | Admitting: Obstetrics

## 2013-05-07 ENCOUNTER — Other Ambulatory Visit: Payer: 59

## 2013-05-07 ENCOUNTER — Other Ambulatory Visit (INDEPENDENT_AMBULATORY_CARE_PROVIDER_SITE_OTHER): Payer: 59

## 2013-05-07 VITALS — BP 127/89 | Temp 97.9°F | Wt 216.0 lb

## 2013-05-07 DIAGNOSIS — Z3482 Encounter for supervision of other normal pregnancy, second trimester: Secondary | ICD-10-CM

## 2013-05-07 DIAGNOSIS — Z3480 Encounter for supervision of other normal pregnancy, unspecified trimester: Secondary | ICD-10-CM

## 2013-05-07 DIAGNOSIS — Z1389 Encounter for screening for other disorder: Secondary | ICD-10-CM

## 2013-05-07 LAB — POCT URINALYSIS DIPSTICK
Bilirubin, UA: NEGATIVE
Blood, UA: NEGATIVE
Glucose, UA: NEGATIVE
Leukocytes, UA: NEGATIVE
pH, UA: 6

## 2013-05-07 NOTE — Progress Notes (Signed)
HR - 86 Pt in office today for routine OB visit, denies concerns at this time  Subjective:    [name] is a 32 y.o. [id number] [redacted]w[redacted]d being seen today for her obstetrical visit.  Patient reports no complaints. Fetal movement: normal.  Objective:    BP 127/89  Temp(Src) 97.9 F (36.6 C)  Wt 216 lb (97.977 kg)  LMP 12/19/2012  Physical Exam  Exam  FHT:    Uterine Size:    Presentation:       Assessment:    Pregnancy:  [id number]    Plan:    Patient Active Problem List   Diagnosis Date Noted  . Nausea and vomiting in pregnancy prior to [redacted] weeks gestation 01/30/2013  . VAGINAL DISCHARGE 07/25/2007  . URI 07/04/2007  . UNSPECIFIED GENITAL HERPES 07/03/2007  . VAGINITIS NOS 03/02/2007  . DYSURIA 03/02/2007  . OBESITY NOS 09/01/2006  . SYPHILIS, UNSPECIFIED 07/20/2006    Routine Follow up in 2 Weeks.

## 2013-05-08 ENCOUNTER — Encounter: Payer: Self-pay | Admitting: Obstetrics

## 2013-05-08 LAB — US OB DETAIL + 14 WK

## 2013-05-09 ENCOUNTER — Ambulatory Visit (HOSPITAL_COMMUNITY): Payer: Self-pay

## 2013-05-09 ENCOUNTER — Encounter: Payer: 59 | Admitting: Obstetrics

## 2013-05-23 NOTE — L&D Delivery Note (Signed)
Delivery Note At 11:28 AM a viable female was delivered via Vaginal, Spontaneous Delivery (Presentation: Left Occiput Anterior).  APGAR: , ; weight 4 lb 2.8 oz (1895 g).   Placenta status: Intact, Spontaneous.  Cord: 3 vessels with the following complications: None.  Cord pH: not done  Anesthesia: None  Episiotomy: None Lacerations: None Suture Repair: 2.0 Est. Blood Loss (mL): 200  Mom to postpartum.  Baby to Couplet care / Skin to Skin.  MARSHALL,BERNARD A 08/26/2013, 11:47 AM

## 2013-06-04 ENCOUNTER — Encounter: Payer: 59 | Admitting: Obstetrics

## 2013-06-04 ENCOUNTER — Other Ambulatory Visit: Payer: 59

## 2013-06-09 ENCOUNTER — Encounter (HOSPITAL_COMMUNITY): Payer: Self-pay | Admitting: *Deleted

## 2013-06-09 ENCOUNTER — Inpatient Hospital Stay (HOSPITAL_COMMUNITY)
Admission: AD | Admit: 2013-06-09 | Discharge: 2013-06-10 | Disposition: A | Discharge status: Home / Self Care | Payer: 59 | Source: Ambulatory Visit | Attending: Obstetrics | Admitting: Obstetrics

## 2013-06-09 DIAGNOSIS — O219 Vomiting of pregnancy, unspecified: Secondary | ICD-10-CM

## 2013-06-09 DIAGNOSIS — O212 Late vomiting of pregnancy: Secondary | ICD-10-CM | POA: Insufficient documentation

## 2013-06-09 HISTORY — DX: Gastro-esophageal reflux disease without esophagitis: K21.9

## 2013-06-09 MED ORDER — DEXTROSE IN LACTATED RINGERS 5 % IV SOLN
Freq: Once | INTRAVENOUS | Status: AC
  Administered 2013-06-09: 23:00:00 via INTRAVENOUS

## 2013-06-09 MED ORDER — PROMETHAZINE HCL 12.5 MG PO TABS: 12.5000 mg | ORAL_TABLET | Freq: Four times a day (QID) | ORAL | Status: DC | PRN

## 2013-06-09 MED ORDER — PROMETHAZINE HCL 25 MG/ML IJ SOLN
25.0000 mg | Freq: Once | INTRAMUSCULAR | Status: AC
  Administered 2013-06-09: 25 mg via INTRAVENOUS
  Filled 2013-06-09: qty 1

## 2013-06-09 NOTE — MAU Provider Note (Signed)
  History     CSN: [id number]  Arrival date and time: 06/09/13 2135   First Provider Initiated Contact with Patient 06/09/13 2230      Chief Complaint  Patient presents with  . Hyperemesis Gravidarum   HPI  [name] is a 33 y.o. [id number] at 6437w4d who presents today with nausea and vomiting. She states that she has been unable to keep anything down for about 3 days now. She has been taking Meclazine, but it is no longer working. She denies any UC, VB, LOF. She states that the baby is moving normally. She is unable to leave a urine sample at this time.   Past Medical History  Diagnosis Date  . Preterm labor   . GERD (gastroesophageal reflux disease)     Past Surgical History  Procedure Laterality Date  . Cesarean section    . Breast lumpectomy Right   . Tendon repair Right     Right Hand  . Wisdom tooth extraction      Family History  Problem Relation Age of Onset  . Cancer Maternal Grandmother     History  Substance Use Topics  . Smoking status: Never Smoker   . Smokeless tobacco: Not on file  . Alcohol Use: No    Allergies: No Known Allergies  Prescriptions prior to admission  Medication Sig Dispense Refill  . meclizine (ANTIVERT) 25 MG tablet Take 25 mg by mouth 3 (three) times daily as needed for nausea.      . Prenatal Vit-Fe Fumarate-FA (PRENATAL MULTIVITAMIN) TABS tablet Take 1 tablet by mouth daily at 12 noon.        ROS Physical Exam   Blood pressure 122/77, pulse 112, temperature 99.1 F (37.3 C), temperature source Oral, resp. rate 16, height 5\' 4"  (1.626 m), weight 95.346 kg (210 lb 3.2 oz), last menstrual period 12/19/2012, SpO2 99.00%.  Physical Exam  Nursing note and vitals reviewed. Constitutional: She is oriented to person, place, and time. She appears well-developed and well-nourished. No distress.  Cardiovascular: Normal rate.   Respiratory: Effort normal.  GI: Soft. There is no tenderness.  Neurological: She is alert and oriented  to person, place, and time.  Skin: Skin is warm and dry.  Psychiatric: She has a normal mood and affect.    MAU Course  Procedures  2357: Patient reports her nausea has improved.   Assessment and Plan   1. Nausea/vomiting in pregnancy      Medication List         meclizine 25 MG tablet  Commonly known as:  ANTIVERT  Take 25 mg by mouth 3 (three) times daily as needed for nausea.     prenatal multivitamin Tabs tablet  Take 1 tablet by mouth daily at 12 noon.     promethazine 12.5 MG tablet  Commonly known as:  PHENERGAN  Take 1 tablet (12.5 mg total) by mouth every 6 (six) hours as needed for nausea or vomiting.       Follow-up Information   Follow up with HARPER,CHARLES A, MD. (as scheduled )    Specialty:  Obstetrics and Gynecology   Contact information:   [address] [place] [address] [phone]        Tawnya CrookHogan, Arneisha Kincannon Donovan 06/09/2013, 10:32 PM

## 2013-06-09 NOTE — Discharge Instructions (Signed)
Morning Sickness °Morning sickness is when you feel sick to your stomach (nauseous) during pregnancy. This nauseous feeling may or may not come with vomiting. It often occurs in the morning but can be a problem any time of day. Morning sickness is most common during the first trimester, but it may continue throughout pregnancy. While morning sickness is unpleasant, it is usually harmless unless you develop severe and continual vomiting (hyperemesis gravidarum). This condition requires more intense treatment.  °CAUSES  °The cause of morning sickness is not completely known but seems to be related to normal hormonal changes that occur in pregnancy. °RISK FACTORS °You are at greater risk if you: °· Experienced nausea or vomiting before your pregnancy. °· Had morning sickness during a previous pregnancy. °· Are pregnant with more than one baby, such as twins. °TREATMENT  °Do not use any medicines (prescription, over-the-counter, or herbal) for morning sickness without first talking to your health care provider. Your health care provider may prescribe or recommend: °· Vitamin B6 supplements. °· Anti-nausea medicines. °· The herbal medicine ginger. °HOME CARE INSTRUCTIONS  °· Only take over-the-counter or prescription medicines as directed by your health care provider. °· Taking multivitamins before getting pregnant can prevent or decrease the severity of morning sickness in most women.   °· Eat a piece of dry toast or unsalted crackers before getting out of bed in the morning.   °· Eat five or six small meals a day.   °· Eat dry and bland foods (rice, baked potato ). Foods high in carbohydrates are often helpful.  °· Do not drink liquids with your meals. Drink liquids between meals.   °· Avoid greasy, fatty, and spicy foods.   °· Get someone to cook for you if the smell of any food causes nausea and vomiting.   °· If you feel nauseous after taking prenatal vitamins, take the vitamins at night or with a snack.  °· Snack  on protein foods (nuts, yogurt, cheese) between meals if you are hungry.   °· Eat unsweetened gelatins for desserts.   °· Wearing an acupressure wristband (worn for sea sickness) may be helpful.   °· Acupuncture may be helpful.   °· Do not smoke.   °· Get a humidifier to keep the air in your house free of odors.   °· Get plenty of fresh air. °SEEK MEDICAL CARE IF:  °· Your home remedies are not working, and you need medicine. °· You feel dizzy or lightheaded. °· You are losing weight. °SEEK IMMEDIATE MEDICAL CARE IF:  °· You have persistent and uncontrolled nausea and vomiting. °· You pass out (faint). °Document Released: 06/30/2006 Document Revised: 01/09/2013 Document Reviewed: 10/24/2012 °Exit[employer]® Patient Information ©2014 [employer]. ° °

## 2013-06-09 NOTE — MAU Note (Signed)
Pt states has hyperemesis gravidarum but states meds are no longer working. Unable to hold down food since Wednesday, and no fluids since Thursday without vomiting. Denies diarrhea. Denies fever. Denies abdominal pain/ctx/LOF/vaginal bleeding. Positive fetal movement.

## 2013-06-10 DIAGNOSIS — O212 Late vomiting of pregnancy: Secondary | ICD-10-CM | POA: Diagnosis not present

## 2013-06-11 ENCOUNTER — Ambulatory Visit (INDEPENDENT_AMBULATORY_CARE_PROVIDER_SITE_OTHER): Payer: 59 | Admitting: Obstetrics

## 2013-06-11 ENCOUNTER — Encounter (HOSPITAL_COMMUNITY): Payer: Self-pay | Admitting: *Deleted

## 2013-06-11 ENCOUNTER — Other Ambulatory Visit: Payer: 59

## 2013-06-11 ENCOUNTER — Encounter: Payer: Self-pay | Admitting: *Deleted

## 2013-06-11 ENCOUNTER — Inpatient Hospital Stay (HOSPITAL_COMMUNITY)
Admission: AD | Admit: 2013-06-11 | Discharge: 2013-06-14 | DRG: 781 | Disposition: A | Discharge status: Home / Self Care | Payer: 59 | Source: Ambulatory Visit | Attending: Obstetrics | Admitting: Obstetrics

## 2013-06-11 ENCOUNTER — Encounter: Payer: Self-pay | Admitting: Obstetrics & Gynecology

## 2013-06-11 VITALS — BP 135/87 | Temp 97.2°F | Wt 211.0 lb

## 2013-06-11 DIAGNOSIS — J069 Acute upper respiratory infection, unspecified: Secondary | ICD-10-CM | POA: Diagnosis present

## 2013-06-11 DIAGNOSIS — O212 Late vomiting of pregnancy: Principal | ICD-10-CM | POA: Diagnosis present

## 2013-06-11 DIAGNOSIS — O99891 Other specified diseases and conditions complicating pregnancy: Secondary | ICD-10-CM | POA: Diagnosis present

## 2013-06-11 DIAGNOSIS — E876 Hypokalemia: Secondary | ICD-10-CM

## 2013-06-11 DIAGNOSIS — Z3480 Encounter for supervision of other normal pregnancy, unspecified trimester: Secondary | ICD-10-CM

## 2013-06-11 DIAGNOSIS — O210 Mild hyperemesis gravidarum: Secondary | ICD-10-CM | POA: Diagnosis present

## 2013-06-11 DIAGNOSIS — O9989 Other specified diseases and conditions complicating pregnancy, childbirth and the puerperium: Secondary | ICD-10-CM

## 2013-06-11 DIAGNOSIS — R1110 Vomiting, unspecified: Secondary | ICD-10-CM | POA: Diagnosis present

## 2013-06-11 HISTORY — DX: Unspecified infectious disease: B99.9

## 2013-06-11 LAB — POCT URINALYSIS DIPSTICK
Bilirubin, UA: NEGATIVE
GLUCOSE UA: NEGATIVE
LEUKOCYTES UA: NEGATIVE
NITRITE UA: NEGATIVE
Spec Grav, UA: 1.02
Urobilinogen, UA: NEGATIVE
pH, UA: 5

## 2013-06-11 LAB — CBC WITH DIFFERENTIAL/PLATELET
Basophils Absolute: 0 10*3/uL (ref 0.0–0.1)
Basophils Relative: 0 % (ref 0–1)
EOS ABS: 0.1 10*3/uL (ref 0.0–0.7)
EOS PCT: 2 % (ref 0–5)
HCT: 32.5 % — ABNORMAL LOW (ref 36.0–46.0)
Hemoglobin: 10.9 g/dL — ABNORMAL LOW (ref 12.0–15.0)
Lymphocytes Relative: 25 % (ref 12–46)
Lymphs Abs: 1.4 10*3/uL (ref 0.7–4.0)
MCH: 27.3 pg (ref 26.0–34.0)
MCHC: 33.5 g/dL (ref 30.0–36.0)
MCV: 81.5 fL (ref 78.0–100.0)
MONOS PCT: 9 % (ref 3–12)
Monocytes Absolute: 0.5 10*3/uL (ref 0.1–1.0)
NEUTROS PCT: 64 % (ref 43–77)
Neutro Abs: 3.6 10*3/uL (ref 1.7–7.7)
PLATELETS: 159 10*3/uL (ref 150–400)
RBC: 3.99 MIL/uL (ref 3.87–5.11)
RDW: 13.4 % (ref 11.5–15.5)
WBC: 5.7 10*3/uL (ref 4.0–10.5)

## 2013-06-11 LAB — COMPREHENSIVE METABOLIC PANEL
ALT: 7 U/L (ref 0–35)
AST: 12 U/L (ref 0–37)
Albumin: 3 g/dL — ABNORMAL LOW (ref 3.5–5.2)
Alkaline Phosphatase: 50 U/L (ref 39–117)
BUN: 5 mg/dL — AB (ref 6–23)
CALCIUM: 9.2 mg/dL (ref 8.4–10.5)
CO2: 22 mEq/L (ref 19–32)
Chloride: 104 mEq/L (ref 96–112)
Creatinine, Ser: 0.61 mg/dL (ref 0.50–1.10)
GFR calc non Af Amer: >90 mL/min (ref >90)
Glucose, Bld: 82 mg/dL (ref 70–99)
Potassium: 3.4 mEq/L — ABNORMAL LOW (ref 3.7–5.3)
SODIUM: 140 meq/L (ref 137–147)
TOTAL PROTEIN: 6.4 g/dL (ref 6.0–8.3)
Total Bilirubin: 0.8 mg/dL (ref 0.3–1.2)

## 2013-06-11 LAB — TYPE AND SCREEN
ABO/RH(D): A POS
Antibody Screen: NEGATIVE

## 2013-06-11 MED ORDER — POTASSIUM CHLORIDE 10 MEQ/100ML IV SOLN
10.0000 meq | INTRAVENOUS | Status: DC
  Filled 2013-06-11 (×2): qty 100

## 2013-06-11 MED ORDER — PRENATAL MULTIVITAMIN CH: 1.0000 | ORAL_TABLET | Freq: Every day | ORAL | Status: DC

## 2013-06-11 MED ORDER — LACTATED RINGERS IV BOLUS (SEPSIS)
1000.0000 mL | Freq: Once | INTRAVENOUS | Status: AC
  Administered 2013-06-11: 1000 mL via INTRAVENOUS

## 2013-06-11 MED ORDER — PROMETHAZINE HCL 25 MG/ML IJ SOLN
25.0000 mg | Freq: Once | INTRAVENOUS | Status: AC
  Administered 2013-06-11: 25 mg via INTRAVENOUS
  Filled 2013-06-11: qty 1

## 2013-06-11 MED ORDER — POTASSIUM CHLORIDE 10 MEQ/100ML IV SOLN
10.0000 meq | INTRAVENOUS | Status: DC
  Filled 2013-06-11 (×4): qty 100

## 2013-06-11 MED ORDER — ACETAMINOPHEN 325 MG PO TABS
650.0000 mg | ORAL_TABLET | ORAL | Status: DC | PRN
  Administered 2013-06-14: 650 mg via ORAL
  Filled 2013-06-11: qty 2

## 2013-06-11 MED ORDER — ZOLPIDEM TARTRATE 5 MG PO TABS
5.0000 mg | ORAL_TABLET | Freq: Every evening | ORAL | Status: DC | PRN
  Administered 2013-06-13: 5 mg via ORAL
  Filled 2013-06-11: qty 1

## 2013-06-11 MED ORDER — ONDANSETRON HCL 4 MG/2ML IJ SOLN
4.0000 mg | Freq: Once | INTRAMUSCULAR | Status: AC
  Administered 2013-06-11: 4 mg via INTRAVENOUS
  Filled 2013-06-11: qty 2

## 2013-06-11 MED ORDER — LACTATED RINGERS IV SOLN
INTRAVENOUS | Status: DC
  Administered 2013-06-12 – 2013-06-13 (×6): via INTRAVENOUS

## 2013-06-11 MED ORDER — CALCIUM CARBONATE ANTACID 500 MG PO CHEW
2.0000 | CHEWABLE_TABLET | ORAL | Status: DC | PRN
  Administered 2013-06-12 (×3): 400 mg via ORAL
  Filled 2013-06-11 (×4): qty 1

## 2013-06-11 MED ORDER — PROMETHAZINE HCL 25 MG/ML IJ SOLN
12.5000 mg | Freq: Four times a day (QID) | INTRAMUSCULAR | Status: DC | PRN
  Administered 2013-06-11 – 2013-06-13 (×2): 12.5 mg via INTRAVENOUS
  Filled 2013-06-11 (×2): qty 1

## 2013-06-11 MED ORDER — POTASSIUM CHLORIDE 10 MEQ/100ML IV SOLN
10.0000 meq | INTRAVENOUS | Status: AC
  Administered 2013-06-11 (×2): 10 meq via INTRAVENOUS
  Filled 2013-06-11 (×2): qty 100

## 2013-06-11 MED ORDER — LACTATED RINGERS IV SOLN
Freq: Every day | INTRAVENOUS | Status: DC
  Administered 2013-06-11: 18:00:00 via INTRAVENOUS
  Filled 2013-06-11: qty 1000

## 2013-06-11 MED ORDER — DOCUSATE SODIUM 100 MG PO CAPS
100.0000 mg | ORAL_CAPSULE | Freq: Every day | ORAL | Status: DC
  Administered 2013-06-12 – 2013-06-14 (×3): 100 mg via ORAL
  Filled 2013-06-11 (×3): qty 1

## 2013-06-11 NOTE — MAU Note (Signed)
Patient states she has had nausea and vomiting for the entire pregnancy. Has gotten worse and has not been able to keep anything down since 1-15. Was seen in the office this am and sent to MAU for evaluation. Denies contractions, bleeding or leaking and reports good fetal movement.

## 2013-06-11 NOTE — Progress Notes (Signed)
Pulse: 118 Patient states she is unable to keep anything down. Meclizine and Phenergan are not working.

## 2013-06-11 NOTE — H&P (Signed)
[name] is a 33 y.o. female presenting for N/V. Maternal Medical History:  Reason for admission: Nausea.  33 yo G6 P3112.  EDC 09-25-13.  Presents with H/O nausea and vomiting, can't keep anything down.  Fetal activity: Perceived fetal activity is normal.   Last perceived fetal movement was within the past hour.    Prenatal Complications - Diabetes: none.    OB History   Grav Para Term Preterm Abortions TAB SAB Ect Mult Living   6 2 1 1 3 3    2      Past Medical History  Diagnosis Date  . Preterm labor   . GERD (gastroesophageal reflux disease)   . Infection     UTI   Past Surgical History  Procedure Laterality Date  . Cesarean section    . Breast lumpectomy Right   . Tendon repair Right     Right Hand  . Wisdom tooth extraction     Family History: family history includes Cancer in her maternal grandmother. Social History:  reports that she has never smoked. She has never used smokeless tobacco. She reports that she drinks alcohol. She reports that she uses illicit drugs (Marijuana).   Prenatal Transfer Tool  Maternal Diabetes: No Genetic Screening: Normal Maternal Ultrasounds/Referrals: Normal Fetal Ultrasounds or other Referrals:  None Maternal Substance Abuse:  No Significant Maternal Medications:  Meds include: Other:  Phenergan Significant Maternal Lab Results:  Lab values include: Other:   K+ = 3.4 Other Comments:  None  Review of Systems  Gastrointestinal: Positive for nausea.  All other systems reviewed and are negative.      Blood pressure 102/76, pulse 85, temperature 97.6 F (36.4 C), temperature source Oral, resp. rate 18, height 5\' 5"  (1.651 m), weight 210 lb 6.4 oz (95.437 kg), last menstrual period 12/19/2012, SpO2 97.00%. Maternal Exam:  Abdomen: Patient reports no abdominal tenderness.   Physical Exam  Nursing note and vitals reviewed. Constitutional: She is oriented to person, place, and time. She appears well-developed and  well-nourished.  HENT:  Head: Normocephalic and atraumatic.  Eyes: Conjunctivae are normal. Pupils are equal, round, and reactive to light.  Neck: Normal range of motion. Neck supple.  Cardiovascular: Normal rate and regular rhythm.   Respiratory: Effort normal and breath sounds normal.  GI: Soft. Bowel sounds are normal.  Neurological: She is alert and oriented to person, place, and time.  Skin: Skin is warm and dry.  Psychiatric: She has a normal mood and affect. Her behavior is normal. Judgment and thought content normal.    Prenatal labs: ABO, Rh: A/POS/-- (10/08 1632) Antibody: NEG (10/08 1632) Rubella: 1.09 (10/08 1632) RPR: NON REAC (10/08 1632)  HBsAg: NEGATIVE (10/08 1632)  HIV: NON REACTIVE (10/08 1632)  GBS:     Assessment/Plan: 24.6 weeks.  Hyperemesis.  Hypokalemia.  Admit.  IV hydration and supportive management.   HARPER,CHARLES A 06/11/2013, 2:53 PM

## 2013-06-11 NOTE — MAU Note (Signed)
Ongoing problem with n/v. No diarrhea.

## 2013-06-11 NOTE — MAU Provider Note (Signed)
CSN: [id number]     Arrival date & time 06/11/13  1010 History   None    Chief Complaint  Patient presents with  . Emesis During Pregnancy   (Consider location/radiation/quality/duration/timing/severity/associated sxs/prior Treatment) Patient is a 33 y.o. female presenting with vomiting. The history is provided by the patient.  Emesis  This is a chronic problem. The problem occurs more than 10 times per day. The problem has been gradually worsening. The emesis has an appearance of stomach contents. There has been no fever. Associated symptoms include chills, cough, a fever, headaches and sweats. Pertinent negatives include no abdominal pain, no diarrhea, no myalgias and no URI.   [name] is a 33 y.o. female @ 1658w6d gestation who presents to MAU with nausea and vomiting. She was sent from Dr. Verdell CarmineHarper's office for IV hydration and medication for nausea. Patient reports that Jan 1st she started with nausea and vomiting. First she was vomiting food but now can't keep liquids or anything down. She had been taking meclizine for nausea and it had helped until Jan. She denies diarrhea, fever or chills or other problems.   Past Medical History  Diagnosis Date  . Preterm labor   . GERD (gastroesophageal reflux disease)   . Infection     UTI   Past Surgical History  Procedure Laterality Date  . Cesarean section    . Breast lumpectomy Right   . Tendon repair Right     Right Hand  . Wisdom tooth extraction     Family History  Problem Relation Age of Onset  . Cancer Maternal Grandmother     breast   History  Substance Use Topics  . Smoking status: Never Smoker   . Smokeless tobacco: Never Used  . Alcohol Use: Yes     Comment: not currently   OB History   Grav Para Term Preterm Abortions TAB SAB Ect Mult Living   6 2 1 1 3 3    2      Review of Systems  Constitutional: Positive for fever and chills.  HENT: Negative for congestion, ear pain and sore throat.   Respiratory:  Positive for cough.   Gastrointestinal: Positive for vomiting. Negative for abdominal pain and diarrhea.  Genitourinary: Negative for dysuria, urgency, frequency and decreased urine volume.  Musculoskeletal: Negative for myalgias.  Skin: Negative for rash.  Neurological: Positive for light-headedness and headaches. Negative for syncope.  Psychiatric/Behavioral: Negative for confusion. The patient is not nervous/anxious.     Allergies  Review of patient's allergies indicates no known allergies.  Home Medications  No current outpatient prescriptions on file. BP 123/75  Pulse 111  Temp(Src) 98.6 F (37 C) (Oral)  Resp 16  Ht 5\' 5"  (1.651 m)  Wt 210 lb 6.4 oz (95.437 kg)  BMI 35.01 kg/m2  SpO2 97%  LMP 12/19/2012 Physical Exam  Nursing note and vitals reviewed. Constitutional: She is oriented to person, place, and time. No distress.  HENT:  Head: Normocephalic and atraumatic.  Eyes: Conjunctivae and EOM are normal.  Neck: Normal range of motion. Neck supple.  Cardiovascular: Tachycardia present.   Pulmonary/Chest: Effort normal. She has no wheezes. She has no rales.  Abdominal: Soft. There is no tenderness.  Gravid, consistent with dates  Musculoskeletal: Normal range of motion.  Neurological: She is alert and oriented to person, place, and time. No cranial nerve deficit.  Skin: Skin is warm and dry.  Psychiatric: She has a normal mood and affect. Her behavior is normal.  Results for orders placed during the hospital encounter of 06/11/13 (from the past 24 hour(s))  CBC WITH DIFFERENTIAL     Status: Abnormal   Collection Time    06/11/13 11:49 AM      Result Value Range   WBC 5.7  4.0 - 10.5 K/uL   RBC 3.99  3.87 - 5.11 MIL/uL   Hemoglobin 10.9 (*) 12.0 - 15.0 g/dL   HCT 92.1 (*) 19.4 - 17.4 %   MCV 81.5  78.0 - 100.0 fL   MCH 27.3  26.0 - 34.0 pg   MCHC 33.5  30.0 - 36.0 g/dL   RDW 08.1  44.8 - 18.5 %   Platelets 159  150 - 400 K/uL   Neutrophils Relative % 64  43  - 77 %   Neutro Abs 3.6  1.7 - 7.7 K/uL   Lymphocytes Relative 25  12 - 46 %   Lymphs Abs 1.4  0.7 - 4.0 K/uL   Monocytes Relative 9  3 - 12 %   Monocytes Absolute 0.5  0.1 - 1.0 K/uL   Eosinophils Relative 2  0 - 5 %   Eosinophils Absolute 0.1  0.0 - 0.7 K/uL   Basophils Relative 0  0 - 1 %   Basophils Absolute 0.0  0.0 - 0.1 K/uL  COMPREHENSIVE METABOLIC PANEL     Status: Abnormal   Collection Time    06/11/13 11:49 AM      Result Value Range   Sodium 140  137 - 147 mEq/L   Potassium 3.4 (*) 3.7 - 5.3 mEq/L   Chloride 104  96 - 112 mEq/L   CO2 22  19 - 32 mEq/L   Glucose, Bld 82  70 - 99 mg/dL   BUN 5 (*) 6 - 23 mg/dL   Creatinine, Ser 6.31  0.50 - 1.10 mg/dL   Calcium 9.2  8.4 - 49.7 mg/dL   Total Protein 6.4  6.0 - 8.3 g/dL   Albumin 3.0 (*) 3.5 - 5.2 g/dL   AST 12  0 - 37 U/L   ALT 7  0 - 35 U/L   Alkaline Phosphatase 50  39 - 117 U/L   Total Bilirubin 0.8  0.3 - 1.2 mg/dL   GFR calc non Af Amer >90  >90 mL/min   GFR calc Af Amer >90  >90 mL/min    ED Course: fetal monitor, labs, IV, zofran, phenergan  Patient continues to have nausea. Will admit for observation and give potassium and continue IVF and medication for nausea.   Procedures  MDM  33 y.o. female with hyperemesis and hypokalemia. Admission orders written. Discussed with the patient and all questioned fully answered. She agrees to plan of care.

## 2013-06-12 ENCOUNTER — Encounter: Payer: Self-pay | Admitting: Obstetrics

## 2013-06-12 DIAGNOSIS — R1110 Vomiting, unspecified: Secondary | ICD-10-CM | POA: Diagnosis present

## 2013-06-12 LAB — COMPREHENSIVE METABOLIC PANEL
ALT: 8 U/L (ref 0–35)
AST: 15 U/L (ref 0–37)
Albumin: 2.7 g/dL — ABNORMAL LOW (ref 3.5–5.2)
Alkaline Phosphatase: 49 U/L (ref 39–117)
BILIRUBIN TOTAL: 0.5 mg/dL (ref 0.3–1.2)
BUN: 4 mg/dL — ABNORMAL LOW (ref 6–23)
CHLORIDE: 106 meq/L (ref 96–112)
CO2: 24 mEq/L (ref 19–32)
CREATININE: 0.59 mg/dL (ref 0.50–1.10)
Calcium: 9.4 mg/dL (ref 8.4–10.5)
GFR calc Af Amer: >90 mL/min (ref >90)
GFR calc non Af Amer: >90 mL/min (ref >90)
Glucose, Bld: 113 mg/dL — ABNORMAL HIGH (ref 70–99)
Potassium: 3.5 mEq/L — ABNORMAL LOW (ref 3.7–5.3)
Sodium: 140 mEq/L (ref 137–147)
TOTAL PROTEIN: 6 g/dL (ref 6.0–8.3)

## 2013-06-12 MED ORDER — VITAMIN B-6 50 MG PO TABS
50.0000 mg | ORAL_TABLET | Freq: Two times a day (BID) | ORAL | Status: DC
  Administered 2013-06-12 – 2013-06-14 (×5): 50 mg via ORAL
  Filled 2013-06-12 (×5): qty 1

## 2013-06-12 MED ORDER — METOCLOPRAMIDE HCL 10 MG PO TABS
5.0000 mg | ORAL_TABLET | Freq: Three times a day (TID) | ORAL | Status: DC
  Administered 2013-06-12 – 2013-06-14 (×6): 5 mg via ORAL
  Filled 2013-06-12 (×6): qty 1

## 2013-06-12 MED ORDER — AZITHROMYCIN 500 MG PO TABS
500.0000 mg | ORAL_TABLET | Freq: Every day | ORAL | Status: AC
  Administered 2013-06-12: 500 mg via ORAL
  Filled 2013-06-12: qty 1

## 2013-06-12 MED ORDER — BOOST / RESOURCE BREEZE PO LIQD
237.0000 mL | Freq: Three times a day (TID) | ORAL | Status: DC
  Administered 2013-06-12 – 2013-06-14 (×5): 1 via ORAL
  Filled 2013-06-12 (×7): qty 1

## 2013-06-12 MED ORDER — POTASSIUM CHLORIDE CRYS ER 20 MEQ PO TBCR
20.0000 meq | EXTENDED_RELEASE_TABLET | Freq: Two times a day (BID) | ORAL | Status: DC
  Administered 2013-06-12 – 2013-06-14 (×6): 20 meq via ORAL
  Filled 2013-06-12 (×6): qty 1

## 2013-06-12 MED ORDER — DOXYLAMINE SUCCINATE (SLEEP) 25 MG PO TABS
25.0000 mg | ORAL_TABLET | Freq: Two times a day (BID) | ORAL | Status: DC
  Administered 2013-06-12 – 2013-06-14 (×5): 25 mg via ORAL
  Filled 2013-06-12 (×5): qty 1

## 2013-06-12 MED ORDER — AZITHROMYCIN 250 MG PO TABS
250.0000 mg | ORAL_TABLET | Freq: Every day | ORAL | Status: DC
  Administered 2013-06-13 – 2013-06-14 (×2): 250 mg via ORAL
  Filled 2013-06-12 (×2): qty 1

## 2013-06-12 MED ORDER — GUAIFENESIN 100 MG/5ML PO SOLN
200.0000 mg | ORAL | Status: DC | PRN
  Administered 2013-06-12 – 2013-06-14 (×3): 200 mg via ORAL
  Filled 2013-06-12: qty 10
  Filled 2013-06-12: qty 15
  Filled 2013-06-12: qty 10
  Filled 2013-06-12: qty 15
  Filled 2013-06-12: qty 10

## 2013-06-12 NOTE — Progress Notes (Signed)
Ur chart review completed.  

## 2013-06-12 NOTE — Progress Notes (Signed)
Antenatal Nutrition Assessment:  Currently  [redacted] weeks gestation, with hyperemesis. Height  65 "  Weight 214 lbs  pre-pregnancy weight 191 lbs .  Pre-pregnancy  BMI 31.9  IBW 125 lbs Total weight gain 23.lbs Weight gain goals 11-20 lbs. Pt has gained back majority of weight lost PTA Estimated needs: 19-2100 kcal/day, 73-83 grams protein/day, 2.2 liters fluid/day  Regular diet tolerated without vomiting today. Kept down a bagel. Reports n/v since Thursday. Hyperemesis with previous preg. This preg has been able to control vomiting with medications allowing weight gain up until last week.  Reviewed diet for hyperemesis with pt, written instructions provided. Changed diet order to antenatal regular and encourged ordering of snacks along with bland low fat menu items. Pt willing to trial Resource, ordered this TID. Can be mixed with ginger ale or juice.   Current diet prescription will provide for increased needs.   nutrition related labs.: Bun low indicative of  recent poor po intake CMP     Component Value Date/Time   NA 140 06/12/2013 1205   K 3.5* 06/12/2013 1205   CL 106 06/12/2013 1205   CO2 24 06/12/2013 1205   GLUCOSE 113* 06/12/2013 1205   BUN 4* 06/12/2013 1205   CREATININE 0.59 06/12/2013 1205   CALCIUM 9.4 06/12/2013 1205   PROT 6.0 06/12/2013 1205   ALBUMIN 2.7* 06/12/2013 1205   AST 15 06/12/2013 1205   ALT 8 06/12/2013 1205   ALKPHOS 49 06/12/2013 1205   BILITOT 0.5 06/12/2013 1205   GFRNONAA >90 06/12/2013 1205   GFRAA >90 06/12/2013 1205     Nutrition Dx: Increased nutrient needs r/t pregnancy and fetal growth requirements aeb [redacted] weeks gestation.   Educational needs assessed   [name] M.Odis LusterEd. R.D. LDN Neonatal Nutrition Support Specialist Pager [phone]

## 2013-06-12 NOTE — Progress Notes (Signed)
Patient ID: [name], female   DOB: Oct 09, 1980, 33 y.o.   MRN: [id number] Hospital Day: 2  S: Less nausea.  O: Blood pressure 107/78, pulse 67, temperature 97.8 F (36.6 C), temperature source Oral, resp. rate 18, height 5\' 5"  (1.651 m), weight 214 lb (97.07 kg), last menstrual period 12/19/2012, SpO2 97.00%.   WUJ:WJXBJYNWFHT:Baseline: 150 bpm Toco: None SVE:   A/P- 33 y.o. admitted with:  N/V and can't keep anything down.  Hypokalemia.  Stable.  Continue supportive management.  Present on Admission:  . Hyperemesis arising during pregnancy  Pregnancy Complications: N/V  Preterm labor management: IV D5LR started Dating:  3915w0d PNL Needed:  K+ FWB:  good PTL:  None

## 2013-06-13 NOTE — Progress Notes (Signed)
Monitors applied

## 2013-06-13 NOTE — Progress Notes (Signed)
Patient ID: [name], female   DOB: [birth year], 33 y.o.   MRN: [id number] Hospital Day: 3  S: Less nausea.  Tolerating some diet.  O: Blood pressure 84/49, pulse 78, temperature 98.4 F (36.9 C), temperature source Oral, resp. rate 18, height 5\' 5"  (1.651 m), weight 214 lb (97.07 kg), last menstrual period 12/19/2012, SpO2 97.00%.   VHQ:IONGEXBMFHT:Baseline: 150 bpm Toco: None SVE:   A/P- 33 y.o. admitted with:  N/V.  Responding well to therapy.  Discharge planning for tomorrow.  Present on Admission:  . Hyperemesis arising during pregnancy . Hyperemesis  Pregnancy Complications: Hyperemesis  Preterm labor management: IV D5LR started Dating:  2928w1d PNL Needed:  none FWB:  good PTL:  none

## 2013-06-14 LAB — COMPREHENSIVE METABOLIC PANEL
ALK PHOS: 50 U/L (ref 39–117)
ALT: 11 U/L (ref 0–35)
AST: 18 U/L (ref 0–37)
Albumin: 2.7 g/dL — ABNORMAL LOW (ref 3.5–5.2)
BILIRUBIN TOTAL: 0.3 mg/dL (ref 0.3–1.2)
BUN: 5 mg/dL — ABNORMAL LOW (ref 6–23)
CHLORIDE: 99 meq/L (ref 96–112)
CO2: 24 meq/L (ref 19–32)
Calcium: 9.6 mg/dL (ref 8.4–10.5)
Creatinine, Ser: 0.5 mg/dL (ref 0.50–1.10)
GFR calc non Af Amer: >90 mL/min (ref >90)
GLUCOSE: 94 mg/dL (ref 70–99)
POTASSIUM: 3.7 meq/L (ref 3.7–5.3)
Sodium: 137 mEq/L (ref 137–147)
TOTAL PROTEIN: 6.3 g/dL (ref 6.0–8.3)

## 2013-06-14 MED ORDER — DSS 100 MG PO CAPS: 100.0000 mg | ORAL_CAPSULE | Freq: Two times a day (BID) | ORAL | Status: DC | PRN

## 2013-06-14 MED ORDER — BOOST / RESOURCE BREEZE PO LIQD: 237.0000 mL | Freq: Three times a day (TID) | ORAL | Status: DC

## 2013-06-14 MED ORDER — DOXYLAMINE SUCCINATE (SLEEP) 25 MG PO TABS: 25.0000 mg | ORAL_TABLET | Freq: Three times a day (TID) | ORAL | Status: DC

## 2013-06-14 MED ORDER — METOCLOPRAMIDE HCL 5 MG PO TABS: 5.0000 mg | ORAL_TABLET | Freq: Three times a day (TID) | ORAL | Status: DC

## 2013-06-14 MED ORDER — AZITHROMYCIN 250 MG PO TABS
ORAL_TABLET | ORAL | Status: AC
Start: 2013-06-14 — End: 2013-06-16

## 2013-06-14 MED ORDER — PYRIDOXINE HCL 50 MG PO TABS: 50.0000 mg | ORAL_TABLET | Freq: Two times a day (BID) | ORAL | Status: DC

## 2013-06-14 MED ORDER — PROMETHAZINE HCL 12.5 MG PO TABS: 12.5000 mg | ORAL_TABLET | Freq: Four times a day (QID) | ORAL | Status: DC | PRN

## 2013-06-14 NOTE — Discharge Summary (Signed)
Physician Discharge Summary  Patient ID: [name] MRN: [id number] DOB/AGE: [birth year] 33 y.o.  Admit date: 06/11/2013 Discharge date: 06/14/2013  Admission Diagnoses:  Discharge Diagnoses:  Active Problems:   Hyperemesis arising during pregnancy   Hyperemesis   Discharged Condition: good  Hospital Course: Stable, improvement of hyperemesis and URI  Consults: None  Significant Diagnostic Studies: labs: low potassium  Treatments: IV hydration and antinausea medication  Discharge Exam: Blood pressure 108/67, pulse 75, temperature 98.8 F (37.1 C), temperature source Oral, resp. rate 18, height 5\' 5"  (1.651 m), weight 220 lb 1.6 oz (99.837 kg), last menstrual period 12/19/2012, SpO2 97.00%. General appearance: alert and cooperative Head: Normocephalic, without obvious abnormality, atraumatic GI: soft, non-tender; bowel sounds normal; no masses,  no organomegaly Neurologic: Grossly normal  Disposition: 01-Home or Self Care  Patient to f/u in clinic next week. Extensively reviewed diet. Encouraged diet and hydration to prevent constipation.  Discharge Orders   Future Appointments Provider Department Dept Phone   06/25/2013 9:00 AM [facility] [phone]   06/25/2013 11:00 AM Brock Badharles A Harper, [facility] [phone]   Future Orders Complete By Expires   Comprehensive metabolic panel  As directed    Questions:     Has the patient fasted?:     Discharge activity:  No Restrictions  As directed    Discharge diet:  No restrictions  As directed    Discharge instructions  As directed    Comments:     Return to clinic next week   Fetal Kick Count:  Lie on our left side for one hour after a meal, and count the number of times your baby kicks.  If it is less than 5 times, get up, move around and drink some juice.  Repeat the test 30 minutes later.  If it is still less than 5 kicks in an hour, notify your doctor.  As directed    Notify  physician for a general feeling that "something is not right"  As directed    Notify physician for increase or change in vaginal discharge  As directed    Notify physician for intestinal cramps, with or without diarrhea, sometimes described as "gas pain"  As directed    Notify physician for leaking of fluid  As directed    Notify physician for low, dull backache, unrelieved by heat or Tylenol  As directed    Notify physician for menstrual like cramps  As directed    Notify physician for pelvic pressure  As directed    Notify physician for uterine contractions.  These may be painless and feel like the uterus is tightening or the baby is  "balling up"  As directed    Notify physician for vaginal bleeding  As directed    PRETERM LABOR:  Includes any of the follwing symptoms that occur between 20 - [redacted] weeks gestation.  If these symptoms are not stopped, preterm labor can result in preterm delivery, placing your baby at risk  As directed        Medication List    STOP taking these medications       guaifenesin 100 MG/5ML syrup  Commonly known as:  ROBITUSSIN      TAKE these medications       azithromycin 250 MG tablet  Commonly known as:  ZITHROMAX  Take last 2 doses, Saturday and Sunday     doxylamine (Sleep) 25 MG tablet  Commonly known as:  UNISOM  Take 1 tablet (  25 mg total) by mouth 3 (three) times daily.     DSS 100 MG Caps  Take 100 mg by mouth 2 (two) times daily as needed for mild constipation.     feeding supplement (RESOURCE BREEZE) Liqd  Take 1 Container by mouth 3 (three) times daily with meals.     metoCLOPramide 5 MG tablet  Commonly known as:  REGLAN  Take 1 tablet (5 mg total) by mouth 3 (three) times daily before meals.     promethazine 12.5 MG tablet  Commonly known as:  PHENERGAN  Take 1 tablet (12.5 mg total) by mouth every 6 (six) hours as needed for nausea or vomiting.     pyridOXINE 50 MG tablet  Commonly known as:  B-6  Take 1 tablet (50 mg total) by  mouth 2 (two) times daily.         SignedWilson Singer, Aracelie Addis 06/14/2013, 8:39 AM

## 2013-06-14 NOTE — Progress Notes (Addendum)
Given warm heel pack without much relief.

## 2013-06-14 NOTE — Discharge Instructions (Signed)
Hyperemesis Gravidarum  Hyperemesis gravidarum is a severe form of nausea and vomiting that happens during pregnancy. Hyperemesis is worse than morning sickness. It may cause you to have nausea or vomiting all day for many days. It may keep you from eating and drinking enough food and liquids. Hyperemesis usually occurs during the first half (the first 20 weeks) of pregnancy. It often goes away once a woman is in her second half of pregnancy. However, sometimes hyperemesis continues through an entire pregnancy.   CAUSES   The cause of this condition is not completely known but is thought to be related to changes in the body's hormones when pregnant. It could be from the high level of the pregnancy hormone or an increase in estrogen in the body.   SIGNS AND SYMPTOMS   · Severe nausea and vomiting.  · Nausea that does not go away.  · Vomiting that does not allow you to keep any food down.  · Weight loss and body fluid loss (dehydration).  · Having no desire to eat or not liking food you have previously enjoyed.  DIAGNOSIS   Your health care provider will do a physical exam and ask you about your symptoms. He or she may also order blood tests and urine tests to make sure something else is not causing the problem.   TREATMENT   You may only need medicine to control the problem. If medicines do not control the nausea and vomiting, you will be treated in the hospital to prevent dehydration, increased acid in the blood (acidosis), weight loss, and changes in the electrolytes in your body that may harm the unborn baby (fetus). You may need IV fluids.   HOME CARE INSTRUCTIONS   · Only take over-the-counter or prescription medicines as directed by your health care provider.  · Try eating a couple of dry crackers or toast in the morning before getting out of bed.  · Avoid foods and smells that upset your stomach.  · Avoid fatty and spicy foods.  · Eat 5 6 small meals a day.  · Do not drink when eating meals. Drink between  meals.  · For snacks, eat high-protein foods, such as cheese.  · Eat or suck on things that have ginger in them. Ginger helps nausea.  · Avoid food preparation. The smell of food can spoil your appetite.  · Avoid iron pills and iron in your multivitamins until after 3 4 months of being pregnant. However, consult with your health care provider before stopping any prescribed iron pills.  SEEK MEDICAL CARE IF:   · Your abdominal pain increases.  · You have a severe headache.  · You have vision problems.  · You are losing weight.  SEEK IMMEDIATE MEDICAL CARE IF:   · You are unable to keep fluids down.  · You vomit blood.  · You have constant nausea and vomiting.  · You have excessive weakness.  · You have extreme thirst.  · You have dizziness or fainting.  · You have a fever or persistent symptoms for more than 2 3 days.  · You have a fever and your symptoms suddenly get worse.  MAKE SURE YOU:   · Understand these instructions.  · Will watch your condition.  · Will get help right away if you are not doing well or get worse.  Document Released: 05/09/2005 Document Revised: 02/27/2013 Document Reviewed: 12/19/2012  ExitCare® Patient Information ©2014 [employer].

## 2013-06-14 NOTE — Progress Notes (Signed)
Ice given for hand discomfort.

## 2013-06-14 NOTE — Progress Notes (Signed)
Patient states understanding of diagnosis and hospital admission.  She states she understands her discharge instructions.  Fetal kick counts discussed along with diet and signs and symptoms of pre term labor.

## 2013-06-25 ENCOUNTER — Other Ambulatory Visit: Payer: 59

## 2013-06-25 ENCOUNTER — Encounter: Payer: Self-pay | Admitting: Obstetrics

## 2013-06-25 ENCOUNTER — Ambulatory Visit (INDEPENDENT_AMBULATORY_CARE_PROVIDER_SITE_OTHER): Payer: 59 | Admitting: Obstetrics

## 2013-06-25 VITALS — BP 109/74 | Temp 98.8°F | Wt 212.0 lb

## 2013-06-25 DIAGNOSIS — Z3480 Encounter for supervision of other normal pregnancy, unspecified trimester: Secondary | ICD-10-CM

## 2013-06-25 LAB — CBC
HEMATOCRIT: 36.2 % (ref 36.0–46.0)
Hemoglobin: 12 g/dL (ref 12.0–15.0)
MCH: 27.9 pg (ref 26.0–34.0)
MCHC: 33.1 g/dL (ref 30.0–36.0)
MCV: 84.2 fL (ref 78.0–100.0)
Platelets: 196 10*3/uL (ref 150–400)
RBC: 4.3 MIL/uL (ref 3.87–5.11)
RDW: 15 % (ref 11.5–15.5)
WBC: 6.6 10*3/uL (ref 4.0–10.5)

## 2013-06-25 NOTE — Progress Notes (Signed)
Pulse: 111 Patient states she is still unable to keep food down.

## 2013-06-26 LAB — GLUCOSE TOLERANCE, 2 HOURS W/ 1HR
Glucose, 1 hour: 113 mg/dL (ref 70–170)
Glucose, 2 hour: 94 mg/dL (ref 70–139)
Glucose, Fasting: 72 mg/dL (ref 70–99)

## 2013-06-26 LAB — RPR

## 2013-06-26 LAB — HIV ANTIBODY (ROUTINE TESTING W REFLEX): HIV: NONREACTIVE

## 2013-07-01 ENCOUNTER — Encounter: Payer: Self-pay | Admitting: Obstetrics

## 2013-07-03 ENCOUNTER — Encounter: Payer: Self-pay | Admitting: Obstetrics

## 2013-07-10 ENCOUNTER — Ambulatory Visit (INDEPENDENT_AMBULATORY_CARE_PROVIDER_SITE_OTHER): Payer: 59 | Admitting: Obstetrics

## 2013-07-10 ENCOUNTER — Encounter: Payer: Self-pay | Admitting: Obstetrics

## 2013-07-10 VITALS — BP 117/81 | Temp 97.3°F | Wt 212.0 lb

## 2013-07-10 DIAGNOSIS — R112 Nausea with vomiting, unspecified: Secondary | ICD-10-CM | POA: Insufficient documentation

## 2013-07-10 DIAGNOSIS — J029 Acute pharyngitis, unspecified: Secondary | ICD-10-CM | POA: Insufficient documentation

## 2013-07-10 DIAGNOSIS — Z1389 Encounter for screening for other disorder: Secondary | ICD-10-CM

## 2013-07-10 DIAGNOSIS — Z3480 Encounter for supervision of other normal pregnancy, unspecified trimester: Secondary | ICD-10-CM

## 2013-07-10 LAB — POCT URINALYSIS DIPSTICK
BILIRUBIN UA: NEGATIVE
Blood, UA: NEGATIVE
Glucose, UA: NEGATIVE
LEUKOCYTES UA: NEGATIVE
Nitrite, UA: NEGATIVE
PH UA: 5
Protein, UA: NEGATIVE
Spec Grav, UA: 1.02
Urobilinogen, UA: NEGATIVE

## 2013-07-10 MED ORDER — AZITHROMYCIN 250 MG PO TABS: ORAL_TABLET | ORAL | Status: DC

## 2013-07-10 NOTE — Progress Notes (Signed)
Pulse: 102

## 2013-07-10 NOTE — Progress Notes (Signed)
Continues to have nausea and vomiting.  Dietary instructions given.

## 2013-07-17 ENCOUNTER — Encounter: Payer: Self-pay | Admitting: Obstetrics

## 2013-07-24 ENCOUNTER — Ambulatory Visit (INDEPENDENT_AMBULATORY_CARE_PROVIDER_SITE_OTHER): Payer: 59 | Admitting: Obstetrics

## 2013-07-24 ENCOUNTER — Encounter: Payer: Self-pay | Admitting: Obstetrics

## 2013-07-24 ENCOUNTER — Ambulatory Visit (HOSPITAL_COMMUNITY)
Admission: RE | Admit: 2013-07-24 | Discharge: 2013-07-24 | Disposition: A | Discharge status: Home / Self Care | Payer: 59 | Source: Ambulatory Visit | Attending: Obstetrics | Admitting: Obstetrics

## 2013-07-24 ENCOUNTER — Other Ambulatory Visit: Payer: Self-pay | Admitting: Obstetrics

## 2013-07-24 VITALS — BP 129/82 | Temp 98.6°F | Wt 203.0 lb

## 2013-07-24 DIAGNOSIS — Z1389 Encounter for screening for other disorder: Secondary | ICD-10-CM

## 2013-07-24 DIAGNOSIS — Z3689 Encounter for other specified antenatal screening: Secondary | ICD-10-CM | POA: Insufficient documentation

## 2013-07-24 DIAGNOSIS — Z3480 Encounter for supervision of other normal pregnancy, unspecified trimester: Secondary | ICD-10-CM

## 2013-07-24 LAB — POCT URINALYSIS DIPSTICK
Bilirubin, UA: NEGATIVE
Blood, UA: NEGATIVE
Glucose, UA: NEGATIVE
LEUKOCYTES UA: NEGATIVE
Nitrite, UA: NEGATIVE
PH UA: 5
Spec Grav, UA: 1.02
UROBILINOGEN UA: NEGATIVE

## 2013-07-24 NOTE — Progress Notes (Signed)
Pulse 84 Pt states that she is not able to keep any food or drink down, except water.  Pt has been taking Reglan with the B6 and Phenergan with no relief.

## 2013-07-29 ENCOUNTER — Encounter: Payer: Self-pay | Admitting: Obstetrics

## 2013-08-07 ENCOUNTER — Ambulatory Visit (INDEPENDENT_AMBULATORY_CARE_PROVIDER_SITE_OTHER): Payer: 59 | Admitting: Obstetrics

## 2013-08-07 VITALS — BP 136/84 | Temp 98.0°F | Wt 196.0 lb

## 2013-08-07 DIAGNOSIS — Z3480 Encounter for supervision of other normal pregnancy, unspecified trimester: Secondary | ICD-10-CM

## 2013-08-07 NOTE — Progress Notes (Signed)
Pulse 94 Pt states she is still unable to keep food/drink down.  Pt has tried using Boost as supplement and causes severe heartburn.  Pt has noticed weight loss since last visit-7lbs. Pt states that none of the medication she has taken has worked for nausea.

## 2013-08-08 ENCOUNTER — Encounter: Payer: Self-pay | Admitting: Obstetrics

## 2013-08-09 ENCOUNTER — Encounter: Payer: Self-pay | Admitting: Obstetrics

## 2013-08-10 ENCOUNTER — Inpatient Hospital Stay (HOSPITAL_COMMUNITY)
Admission: AD | Admit: 2013-08-10 | Discharge: 2013-08-10 | Disposition: A | Discharge status: Home / Self Care | Payer: 59 | Source: Ambulatory Visit | Attending: Obstetrics & Gynecology | Admitting: Obstetrics & Gynecology

## 2013-08-10 ENCOUNTER — Encounter (HOSPITAL_COMMUNITY): Payer: Self-pay | Admitting: *Deleted

## 2013-08-10 DIAGNOSIS — R1110 Vomiting, unspecified: Secondary | ICD-10-CM

## 2013-08-10 DIAGNOSIS — K219 Gastro-esophageal reflux disease without esophagitis: Secondary | ICD-10-CM | POA: Insufficient documentation

## 2013-08-10 DIAGNOSIS — O212 Late vomiting of pregnancy: Secondary | ICD-10-CM | POA: Insufficient documentation

## 2013-08-10 DIAGNOSIS — R1115 Cyclical vomiting syndrome unrelated to migraine: Secondary | ICD-10-CM

## 2013-08-10 LAB — URINALYSIS, ROUTINE W REFLEX MICROSCOPIC
GLUCOSE, UA: NEGATIVE mg/dL
Hgb urine dipstick: NEGATIVE
Leukocytes, UA: NEGATIVE
NITRITE: NEGATIVE
PROTEIN: 100 mg/dL — AB
Specific Gravity, Urine: >1.03 — ABNORMAL HIGH (ref 1.005–1.030)
Urobilinogen, UA: 0.2 mg/dL (ref 0.0–1.0)
pH: 5.5 (ref 5.0–8.0)

## 2013-08-10 LAB — URINE MICROSCOPIC-ADD ON

## 2013-08-10 MED ORDER — FAMOTIDINE 40 MG PO TABS: 40.0000 mg | ORAL_TABLET | Freq: Two times a day (BID) | ORAL | Status: DC

## 2013-08-10 MED ORDER — PROCHLORPERAZINE EDISYLATE 5 MG/ML IJ SOLN
10.0000 mg | Freq: Once | INTRAMUSCULAR | Status: AC
  Administered 2013-08-10: 10 mg via INTRAVENOUS
  Filled 2013-08-10: qty 2

## 2013-08-10 MED ORDER — SODIUM CHLORIDE 0.9 % IV BOLUS (SEPSIS)
1000.0000 mL | Freq: Once | INTRAVENOUS | Status: AC
  Administered 2013-08-10: 1000 mL via INTRAVENOUS

## 2013-08-10 MED ORDER — ONDANSETRON 8 MG PO TBDP: 8.0000 mg | ORAL_TABLET | Freq: Three times a day (TID) | ORAL | Status: DC | PRN

## 2013-08-10 MED ORDER — FAMOTIDINE IN NACL 20-0.9 MG/50ML-% IV SOLN
20.0000 mg | Freq: Once | INTRAVENOUS | Status: AC
  Administered 2013-08-10: 20 mg via INTRAVENOUS
  Filled 2013-08-10: qty 50

## 2013-08-10 MED ORDER — ONDANSETRON 8 MG/NS 50 ML IVPB
8.0000 mg | Freq: Once | INTRAVENOUS | Status: AC
  Administered 2013-08-10: 8 mg via INTRAVENOUS
  Filled 2013-08-10: qty 8

## 2013-08-10 MED ORDER — DEXTROSE 5 % IN LACTATED RINGERS IV BOLUS
1000.0000 mL | Freq: Once | INTRAVENOUS | Status: AC
  Administered 2013-08-10: 1000 mL via INTRAVENOUS

## 2013-08-10 NOTE — MAU Note (Signed)
Pt states hx of hyperemesis, however for the past few days has not been able to keep anything down.

## 2013-08-10 NOTE — Discharge Instructions (Signed)
Hyperemesis Gravidarum Diet °Hyperemesis gravidarum is a severe form of morning sickness. It is characterized by frequent and severe vomiting. It happens during the first trimester of pregnancy. It may be caused by the rapid hormone changes that happen during pregnancy. It is associated with a 5% weight loss of pre-pregnancy weight. The hyperemesis diet may be used to lessen symptoms of nausea and vomiting. °EATING GUIDELINES °· Eat 5 to 6 small meals daily instead of 3 large meals. °· Avoid foods with strong smells. °· Avoid drinking 30 minutes before and after meals. °· Avoid fried or high-fat foods, such as butter and cream sauces. °· Starchy foods are usually well-tolerated, such as cereal, toast, bread, potatoes, pasta, rice, and pretzels. °· Eat crackers before you get out of bed in the morning. °· Avoid spicy foods. °· Ginger may help with nausea. Add ¼ tsp ginger to hot tea or choose ginger tea. °· Continue to take your prenatal vitamins as directed by your caregiver. °SAMPLE MEAL PLAN °Breakfast  °· ½ cup oatmeal °· 1 slice toast °· 1 tsp heart-healthy margarine °· 1 tsp jelly °· 1 scrambled egg °Midmorning Snack  °· 1 cup low-fat yogurt °Lunch  °· Plain ham sandwich °· Carrot or celery sticks °· 1 small apple °· 3 graham crackers °Midafternoon Snack  °· Cheese and crackers °Dinner °· 4 oz pork tenderloin °· 1 small baked potato °· 1 tsp margarine °· ½ cup broccoli °· ½ cup grapes °Evening Snack °· 1 cup pudding °Document Released: 03/06/2007 Document Revised: 08/01/2011 Document Reviewed: 10/09/2012 °ExitCare® Patient Information ©2014 [employer]. ° °

## 2013-08-10 NOTE — MAU Provider Note (Signed)
History     CSN: [id number]  Arrival date and time: 08/10/13 1253   First Provider Initiated Contact with Patient 08/10/13 1404      Chief Complaint  Patient presents with  . Hyperemesis Gravidarum   HPI  [name] is a 33 y.o. [id number] at [redacted]w[redacted]d who presents today with nausea and vomiting. This has been on ongoing problem for her for this pregnancy. She has been taking Reglan, phenergan and B6 at home. She had been fine until about 2 days ago. At that time she states that the medications stopped working, and she has not been able to hold anything down.   She denies any contractions or abdominal pain, bleeding or LOF and confirms fetal movement.   Past Medical History  Diagnosis Date  . Preterm labor   . GERD (gastroesophageal reflux disease)   . Infection     UTI    Past Surgical History  Procedure Laterality Date  . Cesarean section    . Breast lumpectomy Right   . Tendon repair Right     Right Hand  . Wisdom tooth extraction      Family History  Problem Relation Age of Onset  . Cancer Maternal Grandmother     breast    History  Substance Use Topics  . Smoking status: Never Smoker   . Smokeless tobacco: Never Used  . Alcohol Use: Yes     Comment: not currently    Allergies: No Known Allergies  Prescriptions prior to admission  Medication Sig Dispense Refill  . feeding supplement, RESOURCE BREEZE, (RESOURCE BREEZE) LIQD Take 1 Container by mouth 3 (three) times daily with meals.  30 Container  0  . metoCLOPramide (REGLAN) 5 MG tablet Take 1 tablet (5 mg total) by mouth 3 (three) times daily before meals.  90 tablet  1  . pyridOXINE (B-6) 50 MG tablet Take 25 mg by mouth 3 (three) times daily.      . promethazine (PHENERGAN) 12.5 MG tablet Take 1 tablet (12.5 mg total) by mouth every 6 (six) hours as needed for nausea or vomiting.  30 tablet  1    ROS Physical Exam   Last menstrual period 12/19/2012.  Physical Exam  Nursing note and vitals  reviewed. Constitutional: She is oriented to person, place, and time. She appears well-developed and well-nourished. No distress.  Cardiovascular: Normal rate.   Respiratory: Effort normal.  GI: Soft. There is no tenderness.  Neurological: She is alert and oriented to person, place, and time.  Skin: Skin is warm and dry.  Psychiatric: She has a normal mood and affect.   FHT: 140, moderate with 15x15 accels, no decels Toco: rare UC MAU Course  Procedures  Results for orders placed during the hospital encounter of 08/10/13 (from the past 24 hour(s))  URINALYSIS, ROUTINE W REFLEX MICROSCOPIC     Status: Abnormal   Collection Time    08/10/13  1:15 PM      Result Value Ref Range   Color, Urine YELLOW  YELLOW   APPearance CLEAR  CLEAR   Specific Gravity, Urine >1.030 (*) 1.005 - 1.030   pH 5.5  5.0 - 8.0   Glucose, UA NEGATIVE  NEGATIVE mg/dL   Hgb urine dipstick NEGATIVE  NEGATIVE   Bilirubin Urine SMALL (*) NEGATIVE   Ketones, ur >80 (*) NEGATIVE mg/dL   Protein, ur 010 (*) NEGATIVE mg/dL   Urobilinogen, UA 0.2  0.0 - 1.0 mg/dL   Nitrite NEGATIVE  NEGATIVE  Leukocytes, UA NEGATIVE  NEGATIVE  URINE MICROSCOPIC-ADD ON     Status: Abnormal   Collection Time    08/10/13  1:15 PM      Result Value Ref Range   Squamous Epithelial / LPF FEW (*) RARE   WBC, UA 3-6  <3 WBC/hpf   RBC / HPF 0-2  <3 RBC/hpf   Bacteria, UA FEW (*) RARE   Casts GRANULAR CAST (*) NEGATIVE  COMPREHENSIVE METABOLIC PANEL     Status: Abnormal   Collection Time    08/10/13  2:20 PM      Result Value Ref Range   Sodium 134 (*) 137 - 147 mEq/L   Potassium 3.7  3.7 - 5.3 mEq/L   Chloride 102  96 - 112 mEq/L   CO2 10 (*) 19 - 32 mEq/L   Glucose, Bld 142 (*) 70 - 99 mg/dL   BUN 5 (*) 6 - 23 mg/dL   Creatinine, Ser 1.610.69  0.50 - 1.10 mg/dL   Calcium 9.2  8.4 - 09.610.5 mg/dL   Total Protein 7.0  6.0 - 8.3 g/dL   Albumin 3.1 (*) 3.5 - 5.2 g/dL   AST 18  0 - 37 U/L   ALT 15  0 - 35 U/L   Alkaline Phosphatase  104  39 - 117 U/L   Total Bilirubin 0.9  0.3 - 1.2 mg/dL   GFR calc non Af Amer >90  >90 mL/min   GFR calc Af Amer >90  >90 mL/min   1558: Patient attempted PO challenge, and vomited.  1741: Patient has had another bag of fluids and zofran. Will attempt Ice chips now.  1839: Has been able to keep down ice chips D/W Dr. Tamela OddiJackson-Moore if patient feels ok to go home then she can be DC home. 1845: Patient desires dc home with medication for reflux and some additional nausea meds. Discussed ODT zofran with the patient, and the risks in pregnancy. Patient would like to try that at home for now.    Assessment and Plan   1. Hyperemesis    Return to MAU as needed    Medication List         famotidine 40 MG tablet  Commonly known as:  PEPCID  Take 1 tablet (40 mg total) by mouth 2 (two) times daily.     feeding supplement (RESOURCE BREEZE) Liqd  Take 1 Container by mouth 3 (three) times daily with meals.     metoCLOPramide 5 MG tablet  Commonly known as:  REGLAN  Take 1 tablet (5 mg total) by mouth 3 (three) times daily before meals.     ondansetron 8 MG disintegrating tablet  Commonly known as:  ZOFRAN ODT  Take 1 tablet (8 mg total) by mouth every 8 (eight) hours as needed for nausea or vomiting.     promethazine 12.5 MG tablet  Commonly known as:  PHENERGAN  Take 1 tablet (12.5 mg total) by mouth every 6 (six) hours as needed for nausea or vomiting.     pyridOXINE 50 MG tablet  Commonly known as:  B-6  Take 25 mg by mouth 3 (three) times daily.       Follow-up Information   Follow up with Roseanna RainbowJACKSON-MOORE,LISA A, MD. (As scheduled)    Specialty:  Obstetrics and Gynecology   Contact information:   [address] [place] [address] [phone]        Tawnya CrookHogan, Heather Donovan 08/10/2013, 2:10 PM

## 2013-08-11 LAB — COMPREHENSIVE METABOLIC PANEL
ALBUMIN: 3.1 g/dL — AB (ref 3.5–5.2)
ALT: 15 U/L (ref 0–35)
AST: 18 U/L (ref 0–37)
Alkaline Phosphatase: 104 U/L (ref 39–117)
BUN: 5 mg/dL — ABNORMAL LOW (ref 6–23)
CO2: 10 mEq/L — CL (ref 19–32)
Calcium: 9.2 mg/dL (ref 8.4–10.5)
Chloride: 102 mEq/L (ref 96–112)
Creatinine, Ser: 0.69 mg/dL (ref 0.50–1.10)
GFR calc Af Amer: >90 mL/min (ref >90)
GFR calc non Af Amer: >90 mL/min (ref >90)
Glucose, Bld: 142 mg/dL — ABNORMAL HIGH (ref 70–99)
POTASSIUM: 3.7 meq/L (ref 3.7–5.3)
SODIUM: 134 meq/L — AB (ref 137–147)
TOTAL PROTEIN: 7 g/dL (ref 6.0–8.3)
Total Bilirubin: 0.9 mg/dL (ref 0.3–1.2)

## 2013-08-21 ENCOUNTER — Ambulatory Visit (INDEPENDENT_AMBULATORY_CARE_PROVIDER_SITE_OTHER): Payer: 59 | Admitting: Obstetrics

## 2013-08-21 VITALS — BP 118/79 | Wt 187.0 lb

## 2013-08-21 DIAGNOSIS — O210 Mild hyperemesis gravidarum: Secondary | ICD-10-CM

## 2013-08-21 DIAGNOSIS — Z3480 Encounter for supervision of other normal pregnancy, unspecified trimester: Secondary | ICD-10-CM

## 2013-08-21 DIAGNOSIS — A6004 Herpesviral vulvovaginitis: Secondary | ICD-10-CM

## 2013-08-21 MED ORDER — VALACYCLOVIR HCL 1 G PO TABS: ORAL_TABLET | ORAL | Status: AC

## 2013-08-21 MED ORDER — PROMETHAZINE HCL 25 MG RE SUPP: 25.0000 mg | Freq: Four times a day (QID) | RECTAL | Status: DC | PRN

## 2013-08-21 NOTE — Progress Notes (Signed)
Pulse: 66 Patient states she is unable to hold the resource breeze liquid down. Patient states she cant keep anything down not even water. Patient states she has just been eating ice. Patient would like to know if she can have something for her throat because it raw and burning from throwing up. Patient states she has been having lower abdominal cramping. Patient states she went to the hospital on the 21st  and they took her off the Reglan and put her on Zofran.

## 2013-08-26 ENCOUNTER — Encounter (HOSPITAL_COMMUNITY): Payer: Self-pay | Admitting: *Deleted

## 2013-08-26 ENCOUNTER — Inpatient Hospital Stay (HOSPITAL_COMMUNITY): Payer: 59

## 2013-08-26 ENCOUNTER — Encounter: Payer: Self-pay | Admitting: Obstetrics

## 2013-08-26 ENCOUNTER — Inpatient Hospital Stay (HOSPITAL_COMMUNITY)
Admission: AD | Admit: 2013-08-26 | Discharge: 2013-08-28 | DRG: 774 | Disposition: A | Discharge status: Home / Self Care | Payer: 59 | Source: Ambulatory Visit | Attending: Obstetrics | Admitting: Obstetrics

## 2013-08-26 DIAGNOSIS — O212 Late vomiting of pregnancy: Secondary | ICD-10-CM | POA: Diagnosis present

## 2013-08-26 DIAGNOSIS — O98519 Other viral diseases complicating pregnancy, unspecified trimester: Secondary | ICD-10-CM | POA: Diagnosis present

## 2013-08-26 DIAGNOSIS — A6000 Herpesviral infection of urogenital system, unspecified: Secondary | ICD-10-CM | POA: Diagnosis present

## 2013-08-26 DIAGNOSIS — O4690 Antepartum hemorrhage, unspecified, unspecified trimester: Secondary | ICD-10-CM | POA: Diagnosis present

## 2013-08-26 DIAGNOSIS — O34219 Maternal care for unspecified type scar from previous cesarean delivery: Principal | ICD-10-CM | POA: Diagnosis present

## 2013-08-26 HISTORY — DX: Syphilis, unspecified: A53.9

## 2013-08-26 HISTORY — DX: Herpesviral infection, unspecified: B00.9

## 2013-08-26 HISTORY — DX: Vomiting, unspecified: R11.10

## 2013-08-26 LAB — CBC
HCT: 34.9 % — ABNORMAL LOW (ref 36.0–46.0)
HEMOGLOBIN: 12.6 g/dL (ref 12.0–15.0)
MCH: 28.5 pg (ref 26.0–34.0)
MCHC: 36.1 g/dL — ABNORMAL HIGH (ref 30.0–36.0)
MCV: 79 fL (ref 78.0–100.0)
Platelets: 142 10*3/uL — ABNORMAL LOW (ref 150–400)
RBC: 4.42 MIL/uL (ref 3.87–5.11)
RDW: 15.6 % — ABNORMAL HIGH (ref 11.5–15.5)
WBC: 7.1 10*3/uL (ref 4.0–10.5)

## 2013-08-26 LAB — URINALYSIS, ROUTINE W REFLEX MICROSCOPIC
GLUCOSE, UA: NEGATIVE mg/dL
KETONES UR: 40 mg/dL — AB
Leukocytes, UA: NEGATIVE
Nitrite: NEGATIVE
PH: 6.5 (ref 5.0–8.0)
Protein, ur: NEGATIVE mg/dL
Specific Gravity, Urine: <1.005 — ABNORMAL LOW (ref 1.005–1.030)
Urobilinogen, UA: 1 mg/dL (ref 0.0–1.0)

## 2013-08-26 LAB — URINE MICROSCOPIC-ADD ON

## 2013-08-26 LAB — TYPE AND SCREEN
ABO/RH(D): A POS
Antibody Screen: NEGATIVE

## 2013-08-26 LAB — GROUP B STREP BY PCR: GROUP B STREP BY PCR: NEGATIVE

## 2013-08-26 LAB — RPR: RPR: NONREACTIVE

## 2013-08-26 MED ORDER — CITRIC ACID-SODIUM CITRATE 334-500 MG/5ML PO SOLN: 30.0000 mL | ORAL | Status: DC | PRN

## 2013-08-26 MED ORDER — ONDANSETRON HCL 4 MG PO TABS: 4.0000 mg | ORAL_TABLET | ORAL | Status: DC | PRN

## 2013-08-26 MED ORDER — IBUPROFEN 600 MG PO TABS: 600.0000 mg | ORAL_TABLET | Freq: Four times a day (QID) | ORAL | Status: DC | PRN

## 2013-08-26 MED ORDER — OXYTOCIN 40 UNITS IN LACTATED RINGERS INFUSION - SIMPLE MED: 62.5000 mL/h | INTRAVENOUS | Status: DC

## 2013-08-26 MED ORDER — VALACYCLOVIR HCL 500 MG PO TABS
1000.0000 mg | ORAL_TABLET | Freq: Every day | ORAL | Status: DC
  Filled 2013-08-26 (×3): qty 2

## 2013-08-26 MED ORDER — CITRIC ACID-SODIUM CITRATE 334-500 MG/5ML PO SOLN
ORAL | Status: AC
  Filled 2013-08-26: qty 15

## 2013-08-26 MED ORDER — BENZOCAINE-MENTHOL 20-0.5 % EX AERO
1.0000 "application " | INHALATION_SPRAY | CUTANEOUS | Status: DC | PRN
  Administered 2013-08-26: 1 via TOPICAL
  Filled 2013-08-26: qty 56

## 2013-08-26 MED ORDER — LACTATED RINGERS IV SOLN: 500.0000 mL | INTRAVENOUS | Status: DC | PRN

## 2013-08-26 MED ORDER — SENNOSIDES-DOCUSATE SODIUM 8.6-50 MG PO TABS
2.0000 | ORAL_TABLET | ORAL | Status: DC
  Administered 2013-08-26 – 2013-08-27 (×2): 2 via ORAL
  Filled 2013-08-26 (×2): qty 2

## 2013-08-26 MED ORDER — BUTORPHANOL TARTRATE 1 MG/ML IJ SOLN: 1.0000 mg | INTRAMUSCULAR | Status: DC | PRN

## 2013-08-26 MED ORDER — ACETAMINOPHEN 325 MG PO TABS: 650.0000 mg | ORAL_TABLET | ORAL | Status: DC | PRN

## 2013-08-26 MED ORDER — FLEET ENEMA 7-19 GM/118ML RE ENEM: 1.0000 | ENEMA | RECTAL | Status: DC | PRN

## 2013-08-26 MED ORDER — LACTATED RINGERS IV SOLN: INTRAVENOUS | Status: DC

## 2013-08-26 MED ORDER — OXYTOCIN BOLUS FROM INFUSION: 500.0000 mL | INTRAVENOUS | Status: DC

## 2013-08-26 MED ORDER — PROMETHAZINE HCL 25 MG/ML IJ SOLN
25.0000 mg | Freq: Once | INTRAVENOUS | Status: AC
  Administered 2013-08-26: 25 mg via INTRAVENOUS
  Filled 2013-08-26: qty 1

## 2013-08-26 MED ORDER — ONABOTULINUMTOXINA 100 UNITS IJ SOLR: 100.0000 [IU] | Freq: Once | INTRAMUSCULAR | Status: DC

## 2013-08-26 MED ORDER — WITCH HAZEL-GLYCERIN EX PADS: 1.0000 "application " | MEDICATED_PAD | CUTANEOUS | Status: DC | PRN

## 2013-08-26 MED ORDER — DIBUCAINE 1 % RE OINT: 1.0000 "application " | TOPICAL_OINTMENT | RECTAL | Status: DC | PRN

## 2013-08-26 MED ORDER — ONDANSETRON HCL 4 MG/2ML IJ SOLN: 4.0000 mg | Freq: Four times a day (QID) | INTRAMUSCULAR | Status: DC | PRN

## 2013-08-26 MED ORDER — OXYTOCIN 40 UNITS IN LACTATED RINGERS INFUSION - SIMPLE MED
INTRAVENOUS | Status: AC
  Administered 2013-08-26: 40 [IU]
  Filled 2013-08-26: qty 1000

## 2013-08-26 MED ORDER — IBUPROFEN 600 MG PO TABS
600.0000 mg | ORAL_TABLET | Freq: Four times a day (QID) | ORAL | Status: DC
Start: 2013-08-26 — End: 2013-08-28
  Administered 2013-08-26 – 2013-08-28 (×5): 600 mg via ORAL
  Filled 2013-08-26 (×8): qty 1

## 2013-08-26 MED ORDER — LIDOCAINE HCL (PF) 1 % IJ SOLN: 30.0000 mL | INTRAMUSCULAR | Status: DC | PRN

## 2013-08-26 MED ORDER — ZOLPIDEM TARTRATE 5 MG PO TABS: 5.0000 mg | ORAL_TABLET | Freq: Every evening | ORAL | Status: DC | PRN

## 2013-08-26 MED ORDER — PRENATAL MULTIVITAMIN CH
1.0000 | ORAL_TABLET | Freq: Every day | ORAL | Status: DC
  Administered 2013-08-27 – 2013-08-28 (×2): 1 via ORAL
  Filled 2013-08-26 (×2): qty 1

## 2013-08-26 MED ORDER — DIPHENHYDRAMINE HCL 25 MG PO CAPS: 25.0000 mg | ORAL_CAPSULE | Freq: Four times a day (QID) | ORAL | Status: DC | PRN

## 2013-08-26 MED ORDER — LIDOCAINE HCL (PF) 1 % IJ SOLN
30.0000 mL | INTRAMUSCULAR | Status: DC | PRN
  Filled 2013-08-26: qty 30

## 2013-08-26 MED ORDER — LACTATED RINGERS IV BOLUS (SEPSIS): 1000.0000 mL | Freq: Once | INTRAVENOUS | Status: DC

## 2013-08-26 MED ORDER — IBUPROFEN 600 MG PO TABS
600.0000 mg | ORAL_TABLET | Freq: Four times a day (QID) | ORAL | Status: DC | PRN
Start: 2013-08-26 — End: 2013-08-28
  Administered 2013-08-26 – 2013-08-27 (×3): 600 mg via ORAL
  Filled 2013-08-26: qty 1

## 2013-08-26 MED ORDER — OXYCODONE-ACETAMINOPHEN 5-325 MG PO TABS: 1.0000 | ORAL_TABLET | ORAL | Status: DC | PRN

## 2013-08-26 MED ORDER — LANOLIN HYDROUS EX OINT: TOPICAL_OINTMENT | CUTANEOUS | Status: DC | PRN

## 2013-08-26 MED ORDER — SIMETHICONE 80 MG PO CHEW
80.0000 mg | CHEWABLE_TABLET | ORAL | Status: DC | PRN
  Administered 2013-08-26 – 2013-08-27 (×2): 80 mg via ORAL
  Filled 2013-08-26 (×3): qty 1

## 2013-08-26 MED ORDER — TETANUS-DIPHTH-ACELL PERTUSSIS 5-2.5-18.5 LF-MCG/0.5 IM SUSP
0.5000 mL | Freq: Once | INTRAMUSCULAR | Status: AC
  Administered 2013-08-28: 0.5 mL via INTRAMUSCULAR
  Filled 2013-08-26: qty 0.5

## 2013-08-26 MED ORDER — ONDANSETRON HCL 4 MG/2ML IJ SOLN: 4.0000 mg | INTRAMUSCULAR | Status: DC | PRN

## 2013-08-26 MED ORDER — FERROUS SULFATE 325 (65 FE) MG PO TABS
325.0000 mg | ORAL_TABLET | Freq: Two times a day (BID) | ORAL | Status: DC
Start: 2013-08-26 — End: 2013-08-28
  Administered 2013-08-26 – 2013-08-27 (×3): 325 mg via ORAL
  Filled 2013-08-26 (×4): qty 1

## 2013-08-26 NOTE — MAU Provider Note (Signed)
Chief Complaint:  Contractions   First Provider Initiated Contact with Patient 08/26/13 [phone]      HPI: [name] is a 33 y.o. [id number] at 105w5d who presents to maternity admissions reporting contractions every 6 minutes for 2 hours.  She also reports hyperemesis that has been poorly controlled in last few days, with n/v unresponsive to medications.  She reports taking phenergan last night and zofran this morning, but neither have helped.  She is prescribed a protein drink to have with meals but reports she cannot keep this down.  She reports good fetal movement, denies LOF, vaginal bleeding, vaginal itching/burning, urinary symptoms, h/a, dizziness, or fever/chills.    Past Medical History: Past Medical History  Diagnosis Date  . Preterm labor   . GERD (gastroesophageal reflux disease)   . Infection     UTI    Past obstetric history: OB History  Gravida Para Term Preterm AB SAB TAB Ectopic Multiple Living  6 2 1 1 3  3   2     # Outcome Date GA Lbr Len/2nd Weight Sex Delivery Anes PTL Lv  6 CUR           5 TAB 01/22/12        N  4 TAB 2008        N  3 PRE 01/31/06 [redacted]w[redacted]d  1.531 kg (3 lb 6 oz) F LTCS Spinal Y Y  2 TRM 08/16/01 [redacted]w[redacted]d  2.863 kg (6 lb 5 oz) M SVD EPI N Y  1 TAB 1999        N      Past Surgical History: Past Surgical History  Procedure Laterality Date  . Cesarean section    . Breast lumpectomy Right   . Tendon repair Right     Right Hand  . Wisdom tooth extraction      Family History: Family History  Problem Relation Age of Onset  . Cancer Maternal Grandmother     breast    Social History: History  Substance Use Topics  . Smoking status: Never Smoker   . Smokeless tobacco: Never Used  . Alcohol Use: Yes     Comment: not currently    Allergies: No Known Allergies  Meds:  Prescriptions prior to admission  Medication Sig Dispense Refill  . famotidine (PEPCID) 40 MG tablet Take 1 tablet (40 mg total) by mouth 2 (two) times daily.  60 tablet  2   . ondansetron (ZOFRAN ODT) 8 MG disintegrating tablet Take 1 tablet (8 mg total) by mouth every 8 (eight) hours as needed for nausea or vomiting.  30 tablet  2  . promethazine (PHENERGAN) 25 MG suppository Place 1 suppository (25 mg total) rectally every 6 (six) hours as needed for nausea, vomiting or refractory nausea / vomiting.  30 each  5  . valACYclovir (VALTREX) 1000 MG tablet Take 1 tablet po BID X 5 DAYS, THEN 1PO DAILY FOR SUPPRESSION.  44 tablet  11  . feeding supplement, RESOURCE BREEZE, (RESOURCE BREEZE) LIQD Take 1 Container by mouth 3 (three) times daily with meals.  30 Container  0    ROS: Pertinent findings in history of present illness.  Physical Exam  Blood pressure 121/89, pulse 106, temperature 97.8 F (36.6 C), resp. rate 18, height 5\' 4"  (1.626 m), weight 83.462 kg (184 lb), last menstrual period 12/19/2012. GENERAL: Well-developed, well-nourished female in no acute distress.  HEENT: normocephalic HEART: normal rate RESP: normal effort ABDOMEN: Soft, non-tender, gravid appropriate for gestational age  EXTREMITIES: Nontender, no edema NEURO: alert and oriented  Dilation: 1 Effacement (%): 90 Cervical Position: Posterior Station: -2 Presentation: Vertex Exam by:: Sharen CounterLisa Leftwich Kirby CNM  FHT:  Baseline 135 , moderate variability, accelerations present, no decelerations Contractions: q 6-7 mins   Phenergan 25 mg in 1000 ml LR   Report to Joseph BerkshireJulie Ethier, PA    Medication List    ASK your doctor about these medications       famotidine 40 MG tablet  Commonly known as:  PEPCID  Take 1 tablet (40 mg total) by mouth 2 (two) times daily.     feeding supplement (RESOURCE BREEZE) Liqd  Take 1 Container by mouth 3 (three) times daily with meals.     ondansetron 8 MG disintegrating tablet  Commonly known as:  ZOFRAN ODT  Take 1 tablet (8 mg total) by mouth every 8 (eight) hours as needed for nausea or vomiting.     promethazine 25 MG suppository  Commonly  known as:  PHENERGAN  Place 1 suppository (25 mg total) rectally every 6 (six) hours as needed for nausea, vomiting or refractory nausea / vomiting.     valACYclovir 1000 MG tablet  Commonly known as:  VALTREX  Take 1 tablet po BID X 5 DAYS, THEN 1PO DAILY FOR SUPPRESSION.        Sharen CounterLisa Leftwich-Kirby Certified Nurse-Midwife 08/26/2013 7:56 AM  0800 - Care assumed from [facility] Leftwich-Kirby. Patient receiving IVFs. Reactive FHT. Contractions q 6-8 minutes.  Patient reports to Sharen CounterLisa Leftwich-Kirby, CNM that she was on Valtrex for a current HSV outbreak and if in labor will need a C/S.  Patient reports continued contractions that she states are more painful now.  Cervix is unchanged from previous exam after 1 1/2 hours Patient still has not been able to urinate. 1 litre LR ordered 2 small variable decelerations noted. Toco re-adjusted 0945 - Called Dr. Gaynell FaceMarshall to discuss patient. In OR. Message left to return call to MAU.  Patient now reports vaginal bleeding. Large area of blood ? Urine noted on pad. US ordered.  Multiple variable decelerations noted prior to patient going to US. BPP added to US order 1040 - patient returned from US. Bleeding has increased and patient continued to have increasing pain with contractions.  Discussed patient with Dr. Gaynell FaceMarshall. Admit to L&D for observation. Patient may have 1 mg IV stadol q hour PRN, epidural if she wants. Collect rapid GBS.  Discussed patient's ? HSV outbreak and possible need for C/S. Dr. Gaynell FaceMarshall ordered 1 G Valtrex q day while in patient. Will observe on L&D until determined if C/S is necessary  A: Vaginal bleeding in pregnancy, third trimester  P: Admit to L&D for observation  Freddi StarrJulie N Ethier, PA-C 08/26/2013 8:04 AM

## 2013-08-26 NOTE — MAU Note (Signed)
Contractions every 6 minutes apart for the last couple of hours. Denies vaginal bleeding and leaking of fluid.

## 2013-08-26 NOTE — Progress Notes (Signed)
LC requested to bedside to assist with breast pump.

## 2013-08-26 NOTE — Consult Note (Signed)
Neonatology Note:  Attendance at NSVD:  I was asked by Dr. Marshall to attend this precipitous vaginal delivery at 35 5/7 weeks. The mother is a G6P2A3 A pos, GBS not done with a history of HSV, on Valtrex. She felt that she was having an HSV outbreak today, but no lesion could be seen on exam. ROM 2-3 min before delivery, fluid yellow. Infant vigorous with good spontaneous cry and tone. Needed no suctioning. Ap 9/9. Lungs clear to ausc in DR. He weighs 1895 grams, so was held briefly by his mother in the DR, then was transported to the NICU for admission due to LBW.  Bathsheba Durrett C. Denea Cheaney, MD  

## 2013-08-26 NOTE — MAU Note (Signed)
Patient is unable to collect a urine at this time.

## 2013-08-26 NOTE — MAU Note (Signed)
Patient states she has not been able to keep anything down since January. Everything she eats comes back up besides water.

## 2013-08-26 NOTE — H&P (Signed)
This is Dr. Francoise CeoBernard Tzipporah Nagorski dictating the history and physical on  [name] she's a 33 year old gravida 6 para 113 to at 35 weeks and 5 days her due 7256 GBS unknown she has a history of herpes and was started on medication 1 biotics daily which she has been taking since last Wednesday she came to aspirin this morning thinking she was in labor and having some bleeding the nurse stated in triage she was having irregular contractions cervix was 1 cm 90% and vertex was -2 station she is tender an ultrasound which showed everything and and and called me and stated that patient was not in in about she is now mentioned that she thinks she is getting herpes outbreak the patient was admitted to labor and delivery at 11:25 AM and and that time bulging membranes dilated no medicine given for unknown GBS and she was cough a C-section however post with your on the stretcher she had a normal vaginal delivery by the fellow in a female Apgar 8 and 9 weighing 4 boneless ounces placenta was spontaneous and no herpetic lesions were noted Past medical history negative In past surgical history she had a C-section past System review negative Social history negative Physical exam well-developed female post delivery HEENT negative And a prescription in in Heart regular rhythm no murmurs no gallops Abdomen 20 week postpartum size Pelvic as described above Extremities negative

## 2013-08-27 ENCOUNTER — Encounter (HOSPITAL_COMMUNITY): Payer: Self-pay | Admitting: *Deleted

## 2013-08-27 LAB — CBC
HEMATOCRIT: 30.5 % — AB (ref 36.0–46.0)
HEMOGLOBIN: 10.8 g/dL — AB (ref 12.0–15.0)
MCH: 28.1 pg (ref 26.0–34.0)
MCHC: 35.4 g/dL (ref 30.0–36.0)
MCV: 79.2 fL (ref 78.0–100.0)
Platelets: 134 10*3/uL — ABNORMAL LOW (ref 150–400)
RBC: 3.85 MIL/uL — AB (ref 3.87–5.11)
RDW: 15.9 % — ABNORMAL HIGH (ref 11.5–15.5)
WBC: 8.7 10*3/uL (ref 4.0–10.5)

## 2013-08-27 MED ORDER — FAMOTIDINE 20 MG PO TABS
20.0000 mg | ORAL_TABLET | Freq: Two times a day (BID) | ORAL | Status: DC
  Administered 2013-08-27 – 2013-08-28 (×3): 20 mg via ORAL
  Filled 2013-08-27 (×3): qty 1

## 2013-08-27 NOTE — Progress Notes (Signed)
Post Partum Day 1 Subjective: no complaints, up ad lib, voiding, tolerating PO and + flatus Patient concerned about newborns well being. She believes she has HSV due to perineal itching. She is unaware of a lesion. Patient was breast pumping yesterday and will try again today.  Objective: Blood pressure 109/71, pulse 80, temperature 97.8 F (36.6 C), temperature source Oral, resp. rate 18, height 5\' 4"  (1.626 m), weight 184 lb (83.462 kg), last menstrual period 12/19/2012, SpO2 97.00%, unknown if currently breastfeeding.  Physical Exam:  General: alert, cooperative and appears stated age Lochia: appropriate Uterine Fundus: firm Incision: NA DVT Evaluation: No evidence of DVT seen on physical exam.   Recent Labs  08/26/13 1210 08/27/13 0539  HGB 12.6 10.8*  HCT 34.9* 30.5*    Assessment/Plan: Lactation consult and Contraception discuss prior to discharge. Newborn in NICU.    LOS: 1 day   [facility] Center[facility]EN, Curley Hogen 08/27/2013, 8:42 AM

## 2013-08-28 ENCOUNTER — Encounter (HOSPITAL_COMMUNITY)
Admission: RE | Admit: 2013-08-28 | Discharge: 2013-08-28 | Disposition: A | Discharge status: Home / Self Care | Payer: 59 | Source: Ambulatory Visit | Attending: Obstetrics | Admitting: Obstetrics

## 2013-08-28 ENCOUNTER — Encounter: Payer: 59 | Admitting: Obstetrics

## 2013-08-28 DIAGNOSIS — O923 Agalactia: Secondary | ICD-10-CM | POA: Insufficient documentation

## 2013-08-28 MED ORDER — IBUPROFEN 600 MG PO TABS: 600.0000 mg | ORAL_TABLET | Freq: Four times a day (QID) | ORAL | Status: DC | PRN

## 2013-08-28 MED ORDER — OXYCODONE-ACETAMINOPHEN 5-325 MG PO TABS: 1.0000 | ORAL_TABLET | ORAL | Status: DC | PRN

## 2013-08-28 NOTE — Discharge Instructions (Signed)

## 2013-08-28 NOTE — Progress Notes (Signed)
Discharge instructions reviewed with patient.  Patient states understanding of home care.  No home equipment needed.  Patient ambulated for discharge in stable condition with staff without incident. 

## 2013-08-28 NOTE — Discharge Summary (Signed)
Obstetric Discharge Summary Reason for Admission: onset of labor Prenatal Procedures: ultrasound Intrapartum Procedures: spontaneous vaginal delivery Postpartum Procedures: none Complications-Operative and Postpartum: none Hemoglobin  Date Value Ref Range Status  08/27/2013 10.8* 12.0 - 15.0 g/dL Final     HCT  Date Value Ref Range Status  08/27/2013 30.5* 36.0 - 46.0 % Final    Physical Exam:  General: alert and no distress Lochia: appropriate Uterine Fundus: firm Incision: None DVT Evaluation: No evidence of DVT seen on physical exam.  Discharge Diagnoses: Premature labor and preterm delivery  Discharge Information: Date: 08/28/2013 Activity: pelvic rest Diet: routine Medications: PNV, Ibuprofen, Colace and Percocet Condition: stable Instructions: refer to practice specific booklet Discharge to: home Follow-up Information   Follow up with Nupur Hohman A, MD In 1 week.   Specialty:  Obstetrics and Gynecology   Contact information:   [address] [place] [address] [phone]       Newborn Data: Live born female  Birth Weight: 4 lb 2.8 oz (1895 g) APGAR: 9, 9  Home with mother.  [name] 08/28/2013, 9:46 AM

## 2013-08-28 NOTE — Progress Notes (Signed)
Post Partum Day 2 Subjective: no complaints  Objective: Blood pressure 110/64, pulse 94, temperature 97.6 F (36.4 C), temperature source Oral, resp. rate 18, height 5\' 4"  (1.626 m), weight 184 lb (83.462 kg), last menstrual period 12/19/2012, SpO2 97.00%, unknown if currently breastfeeding.  Physical Exam:  General: alert and no distress Lochia: appropriate Uterine Fundus: firm Incision: None DVT Evaluation: No evidence of DVT seen on physical exam.   Recent Labs  08/26/13 1210 08/27/13 0539  HGB 12.6 10.8*  HCT 34.9* 30.5*    Assessment/Plan: Discharge home   LOS: 2 days   [name] 08/28/2013, 9:38 AM

## 2013-08-28 NOTE — Progress Notes (Signed)
Ur chart review completed.  

## 2013-08-29 ENCOUNTER — Encounter: Payer: 59 | Admitting: Obstetrics

## 2013-08-29 NOTE — Progress Notes (Signed)
Clinical Social Work Department PSYCHOSOCIAL ASSESSMENT - MATERNAL/CHILD 08/28/2013  Patient:  Meinhart,Jaqlyn  Account Number:  [id number]  Admit Date:  08/26/2013  Childs Name:   Asantehene Henebabarimba    Clinical Social Worker:  Renzo Vincelette, LCSW   Date/Time:  08/28/2013 01:00 PM  Date Referred:        Other referral source:   No referral-NICU admission    I:  FAMILY / HOME ENVIRONMENT Child's legal guardian:  PARENT  Guardian - Name Guardian - Age Guardian - Address  [address], [place], [place] [address]  Damien Smith  [place]   Other household support members/support persons Name Relationship DOB  Noah Hampton SON 12  Brooklynne Maye DAUGHTER 7   Other support:   MOB states her main supports are her mother and sister, who live locally.    II  PSYCHOSOCIAL DATA Information Source:  Patient Interview  Financial and Community Resources Employment:   MOB works for [employer].  FOB is a chef.   Financial resources:  Private Insurance If Medicaid - County:  [place]  School / Grade:   Maternity Care Coordinator / Child Services Coordination / Early Interventions:  Cultural issues impacting care:   None stated    III  STRENGTHS Strengths  Adequate Resources  Compliance with medical plan  Home prepared for Child (including basic supplies)  Other - See comment  Supportive family/friends  Understanding of illness   Strength comment:    IV  RISK FACTORS AND CURRENT PROBLEMS Current Problem:  None   Risk Factor & Current Problem Patient Issue Family Issue Risk Factor / Current Problem Comment   N N     V  SOCIAL WORK ASSESSMENT  CSW met with MOB in her third floor room to introduce myself and complete assessment for NICU admission of 35.5 week boy.  MOB was very pleasant and welcoming of CSW.  She had her 5 year old nephew with her, but he was playing and not engaged in the conversation.  She stated she was preparing to discharge  today and that now was a good time to talk.  She reports baby is doing well.  She feels sad to leave him in the hospital, but is understanding of his medical needs.  CSW validated her feelings and discussed common emotions related to the NICU experience as well as PPD signs and symptoms.  MOB denies PPD after her first two children.  She reports having a good support system and states she and FOB are in a long distance relationship.  She states he lives in [place] and that they met on Facebook in June 2014.  She states he plans to visit in the next couple months and that they have not seen each other since baby's conception.  She states the fathers of her first two children are involved in their lives and support each of them.  Her main support people in the area are her mother and sister.  MOB reports having all necessary baby supplies at home.  She plans to be out of work possibly until September for maternity leave, stating she can take as much time as she would like.  She reports no issues with transportation as she has her own car and lives close to the hospital.  CSW inquired about the marijuana use noted in her PNR (CSW first ensured that 33 year old was not paying attention and was very quiet about this).  MOB states no use during pregnancy.    CSW informed her of hospital drug screen policy and she was understanding.  Baby's UDS is negative.  CSW asked about her pediatrician and she states she takes her children to an Urgent Care when needed.  CSW suggests she obtain a pediatrician list from the NICU nurses station and choose a pediatrician that all her children can go to since she will have to choose a pediatrician for baby prior to his discharge.  She states her children have private insurance with Medicaid as a secondary and she has found that some offices will not accept them as patients since they have Medicaid.  CSW informed her of the office who take Medicaid and suggest she either go there or call the  other offices in the area and find out if they will accept new patients with Medicaid as a secondary insurance.  She states she will do this and will let NICU staff know who she chooses for baby.  CSW explained ongoing support services offered by NICU CSW and gave contact information.  CSW is not aware of any social concerns at this time.  MOB was appreciative and thanked CSW for the visit.     VI SOCIAL WORK PLAN Social Work Plan  Patient/Family Education  Psychosocial Support/Ongoing Assessment of Needs   Type of pt/family education:   Ongoing support services offered by NICU CSW  PPD signs and symptoms  Hospital drug screen policy   If child protective services report - county:   If child protective services report - date:   Information/referral to community resources comment:   No referral needs identified at this time.   Other social work plan:   CSW will monitor MDS results.     

## 2013-09-04 ENCOUNTER — Inpatient Hospital Stay (HOSPITAL_COMMUNITY)
Admission: AD | Admit: 2013-09-04 | Discharge: 2013-09-05 | Disposition: A | Discharge status: Home / Self Care | Payer: 59 | Source: Ambulatory Visit | Attending: Obstetrics & Gynecology | Admitting: Obstetrics & Gynecology

## 2013-09-04 ENCOUNTER — Encounter (HOSPITAL_COMMUNITY): Payer: Self-pay | Admitting: *Deleted

## 2013-09-04 DIAGNOSIS — B009 Herpesviral infection, unspecified: Secondary | ICD-10-CM | POA: Insufficient documentation

## 2013-09-04 DIAGNOSIS — J069 Acute upper respiratory infection, unspecified: Secondary | ICD-10-CM | POA: Insufficient documentation

## 2013-09-04 DIAGNOSIS — B9789 Other viral agents as the cause of diseases classified elsewhere: Secondary | ICD-10-CM

## 2013-09-04 DIAGNOSIS — R05 Cough: Secondary | ICD-10-CM | POA: Insufficient documentation

## 2013-09-04 DIAGNOSIS — R079 Chest pain, unspecified: Secondary | ICD-10-CM | POA: Insufficient documentation

## 2013-09-04 DIAGNOSIS — R059 Cough, unspecified: Secondary | ICD-10-CM | POA: Insufficient documentation

## 2013-09-04 DIAGNOSIS — K219 Gastro-esophageal reflux disease without esophagitis: Secondary | ICD-10-CM | POA: Insufficient documentation

## 2013-09-04 DIAGNOSIS — R071 Chest pain on breathing: Secondary | ICD-10-CM | POA: Insufficient documentation

## 2013-09-04 DIAGNOSIS — R0602 Shortness of breath: Secondary | ICD-10-CM | POA: Insufficient documentation

## 2013-09-04 DIAGNOSIS — R0781 Pleurodynia: Secondary | ICD-10-CM

## 2013-09-04 DIAGNOSIS — O1205 Gestational edema, complicating the puerperium: Secondary | ICD-10-CM

## 2013-09-04 NOTE — MAU Note (Signed)
Pt reports shortness of breath, cough, and chest hurting since this am. Also reports worsening of swelling in bilateral feet, ankles and lower legs.

## 2013-09-04 NOTE — MAU Note (Signed)
PT SAYS SHE DEL VAG  ON 4-6- BY  DR MARSHALL--- 35.5 WEEKS-   BABY IN NICU-    - SHE IS PUMPING  AND  GIVING -  HOME TO HOSPITAL.     SAYS CHEST HURTS  IN MIDDLE OF BREAST- STARTED THIS AM- WHILE SHE   WAS LYING  IN BED.-STOPPED   THEN STARTED AGAIN TONIGHT WHILE LYING ON SOFA- THAT'S WHEN SHE HAD TROUBLE BREATHING.    ALSO COUGHS-  DRY.   BOTH FEET/ ANKLES/ LOWER LEGS SWOLLEN-  SKIN SHINY AND TAUNT.    SAYS NO SWELLING WHILE PREG.

## 2013-09-05 DIAGNOSIS — J069 Acute upper respiratory infection, unspecified: Secondary | ICD-10-CM

## 2013-09-05 LAB — URINE MICROSCOPIC-ADD ON

## 2013-09-05 LAB — COMPREHENSIVE METABOLIC PANEL
ALK PHOS: 66 U/L (ref 39–117)
ALT: 30 U/L (ref 0–35)
AST: 26 U/L (ref 0–37)
Albumin: 2.3 g/dL — ABNORMAL LOW (ref 3.5–5.2)
BUN: 12 mg/dL (ref 6–23)
CALCIUM: 8 mg/dL — AB (ref 8.4–10.5)
CHLORIDE: 105 meq/L (ref 96–112)
CO2: 27 meq/L (ref 19–32)
CREATININE: 0.62 mg/dL (ref 0.50–1.10)
GFR calc Af Amer: >90 mL/min (ref >90)
GFR calc non Af Amer: >90 mL/min (ref >90)
GLUCOSE: 92 mg/dL (ref 70–99)
Potassium: 3.5 mEq/L — ABNORMAL LOW (ref 3.7–5.3)
Sodium: 143 mEq/L (ref 137–147)
Total Bilirubin: 0.3 mg/dL (ref 0.3–1.2)
Total Protein: 5.2 g/dL — ABNORMAL LOW (ref 6.0–8.3)

## 2013-09-05 LAB — CBC
HCT: 28.6 % — ABNORMAL LOW (ref 36.0–46.0)
HEMOGLOBIN: 9.3 g/dL — AB (ref 12.0–15.0)
MCH: 28.3 pg (ref 26.0–34.0)
MCHC: 32.5 g/dL (ref 30.0–36.0)
MCV: 86.9 fL (ref 78.0–100.0)
Platelets: 152 10*3/uL (ref 150–400)
RBC: 3.29 MIL/uL — ABNORMAL LOW (ref 3.87–5.11)
RDW: 17.5 % — ABNORMAL HIGH (ref 11.5–15.5)
WBC: 5.4 10*3/uL (ref 4.0–10.5)

## 2013-09-05 LAB — URINALYSIS, ROUTINE W REFLEX MICROSCOPIC
BILIRUBIN URINE: NEGATIVE
GLUCOSE, UA: NEGATIVE mg/dL
Ketones, ur: NEGATIVE mg/dL
Leukocytes, UA: NEGATIVE
Nitrite: NEGATIVE
PH: 6 (ref 5.0–8.0)
Protein, ur: NEGATIVE mg/dL
Specific Gravity, Urine: 1.01 (ref 1.005–1.030)
Urobilinogen, UA: 0.2 mg/dL (ref 0.0–1.0)

## 2013-09-05 MED ORDER — IBUPROFEN 600 MG PO TABS
600.0000 mg | ORAL_TABLET | Freq: Once | ORAL | Status: AC
  Administered 2013-09-05: 600 mg via ORAL
  Filled 2013-09-05: qty 1

## 2013-09-05 MED ORDER — GI COCKTAIL ~~LOC~~
30.0000 mL | Freq: Three times a day (TID) | ORAL | Status: DC | PRN
  Administered 2013-09-05: 30 mL via ORAL
  Filled 2013-09-05: qty 30

## 2013-09-05 NOTE — Discharge Instructions (Signed)

## 2013-09-05 NOTE — MAU Provider Note (Signed)
Chief Complaint: No chief complaint on file.  First Provider Initiated Contact with Patient 09/05/13 0045     SUBJECTIVE HPI: [name] is a 33 y.o. [id number] at 10 days post vaginal delivery who presents with chest pain, shortness of breath and bilateral pedal edema. Chest pain started last night. Patient describes it as worse with breathing, 3/10 on pain scale, worse with lying down and better with sitting. Did not have any chest pain throughout the day today, but pain returned tonight and was associated with shortness of breath. Bilateral pedal edema started today. No history of similar symptoms. No history of hypertensive disorder of pregnancy. Patient also reports non-productive cough and runny nose x1 week. Runny nose resolved, but cough has persisted.  Patient has not tried anything for the pain. Does not report any other aggravating or alleviating factors. Patient is pumping her baby in NICU. No complaints related to breasts.  Past Medical History  Diagnosis Date  . Preterm labor   . GERD (gastroesophageal reflux disease)   . Infection     UTI  . 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 Hyperemesis   . Syphilis 07/20/06   OB History  Gravida Para Term Preterm AB SAB TAB Ectopic Multiple Living  6 3 1 2 3  3   3     # Outcome Date GA Lbr Len/2nd Weight Sex Delivery Anes PTL Lv  6 PRE 08/26/13 6152w5d 05:24 / 00:04 1.895 kg (4 lb 2.8 oz) M VBAC None  Y  5 TAB 01/22/12        N  4 TAB 2008        N  3 PRE 01/31/06 2339w0d  1.531 kg (3 lb 6 oz) F LTCS Spinal Y Y  2 TRM 08/16/01 6241w0d  2.863 kg (6 lb 5 oz) M SVD EPI N Y  1 TAB 1999        N     Past Surgical History  Procedure Laterality Date  . Cesarean section    . Breast lumpectomy Right   . Tendon repair Right     Right Hand  . Wisdom tooth extraction     History   Social History  . Marital Status: Single    Spouse Name: N/A    Number of Children: N/A  . Years of Education:  N/A   Occupational History  . Not on file.   Social History Main Topics  . Smoking status: Never Smoker   . Smokeless tobacco: Never Used  . Alcohol Use: Yes     Comment: not currently  . Drug Use: Yes    Special: Marijuana     Comment: last use June 2014  . Sexual Activity: No     Comment: last sex August 2014   Other Topics Concern  . Not on file   Social History Narrative  . No narrative on file   No current facility-administered medications on file prior to encounter.   Current Outpatient Prescriptions on File Prior to Encounter  Medication Sig Dispense Refill  . ibuprofen (ADVIL,MOTRIN) 600 MG tablet Take 1 tablet (600 mg total) by mouth every 6 (six) hours as needed (pain scale < 4).  30 tablet  5  . valACYclovir (VALTREX) 1000 MG tablet Take 1 tablet po BID X 5 DAYS, THEN 1PO DAILY FOR SUPPRESSION.  44 tablet  11  . oxyCODONE-acetaminophen (PERCOCET/ROXICET) 5-325 MG per tablet Take 1-2 tablets by mouth every 4 (  four) hours as needed for severe pain (moderate - severe pain).  40 tablet  0   No Known Allergies  ROS: Pertinent positive items in HPI. Negative for fever, chills, hemoptysis, wheezing, calf pain, headache, scotoma, epigastric pain, pain radiating to jaw or arms or breast pain or masses.   OBJECTIVE Blood pressure 116/88, pulse 80, temperature 98.8 F (37.1 C), temperature source Oral, resp. rate 20, height 5\' 4"  (1.626 m), weight 97.977 kg (216 lb), last menstrual period 12/19/2012, SpO2 98.00%, unknown if currently breastfeeding. Patient Vitals for the past 24 hrs:  BP Temp Temp src Pulse Resp SpO2 Height Weight  09/05/13 0334 120/79 mmHg - - 71 - - - -  09/05/13 0141 116/88 mmHg - - 80 - 98 % - -  09/05/13 0117 133/94 mmHg - - 64 - 99 % - -  09/05/13 0044 133/82 mmHg - - 68 20 99 % - -  09/04/13 2350 122/77 mmHg - - 81 - 99 % - -  09/04/13 2331 151/89 mmHg (Patient coughing)  98.8 F (37.1 C) Oral 73 18 98 % 5\' 4"  (1.626 m) 97.977 kg (216 lb)     GENERAL: Well-developed, well-nourished female in no acute distress.  HEENT: Normocephalic HEART: normal rate and rhythm. No murmurs rubs or gallops. RESP: normal effort. Clear to auscultation bilaterally. Mild tenderness to palpation along the sternum and costal joints. ABDOMEN: Soft, non-tender EXTREMITIES: Nontender, 3+ bilateral pedal edema. Negative Homans. NEURO: Alert and oriented. Deep tendon reflexes 2+, no clonus.  LAB RESULTS Results for orders placed during the hospital encounter of 09/04/13 (from the past 24 hour(s))  URINALYSIS, ROUTINE W REFLEX MICROSCOPIC     Status: Abnormal   Collection Time    09/04/13 11:34 PM      Result Value Ref Range   Color, Urine YELLOW  YELLOW   APPearance CLEAR  CLEAR   Specific Gravity, Urine 1.010  1.005 - 1.030   pH 6.0  5.0 - 8.0   Glucose, UA NEGATIVE  NEGATIVE mg/dL   Hgb urine dipstick SMALL (*) NEGATIVE   Bilirubin Urine NEGATIVE  NEGATIVE   Ketones, ur NEGATIVE  NEGATIVE mg/dL   Protein, ur NEGATIVE  NEGATIVE mg/dL   Urobilinogen, UA 0.2  0.0 - 1.0 mg/dL   Nitrite NEGATIVE  NEGATIVE   Leukocytes, UA NEGATIVE  NEGATIVE  URINE MICROSCOPIC-ADD ON     Status: None   Collection Time    09/04/13 11:34 PM      Result Value Ref Range   Squamous Epithelial / LPF RARE  RARE   WBC, UA 0-2  <3 WBC/hpf   RBC / HPF 0-2  <3 RBC/hpf   Bacteria, UA RARE  RARE  CBC     Status: Abnormal   Collection Time    09/05/13  1:10 AM      Result Value Ref Range   WBC 5.4  4.0 - 10.5 K/uL   RBC 3.29 (*) 3.87 - 5.11 MIL/uL   Hemoglobin 9.3 (*) 12.0 - 15.0 g/dL   HCT 16.128.6 (*) 09.636.0 - 04.546.0 %   MCV 86.9  78.0 - 100.0 fL   MCH 28.3  26.0 - 34.0 pg   MCHC 32.5  30.0 - 36.0 g/dL   RDW 40.917.5 (*) 81.111.5 - 91.415.5 %   Platelets 152  150 - 400 K/uL  COMPREHENSIVE METABOLIC PANEL     Status: Abnormal   Collection Time    09/05/13  1:10 AM      Result Value  Ref Range   Sodium 143  137 - 147 mEq/L   Potassium 3.5 (*) 3.7 - 5.3 mEq/L   Chloride 105  96 -  112 mEq/L   CO2 27  19 - 32 mEq/L   Glucose, Bld 92  70 - 99 mg/dL   BUN 12  6 - 23 mg/dL   Creatinine, Ser 1.19  0.50 - 1.10 mg/dL   Calcium 8.0 (*) 8.4 - 10.5 mg/dL   Total Protein 5.2 (*) 6.0 - 8.3 g/dL   Albumin 2.3 (*) 3.5 - 5.2 g/dL   AST 26  0 - 37 U/L   ALT 30  0 - 35 U/L   Alkaline Phosphatase 66  39 - 117 U/L   Total Bilirubin 0.3  0.3 - 1.2 mg/dL   GFR calc non Af Amer >90  >90 mL/min   GFR calc Af Amer >90  >90 mL/min    EKG Normal sinus rhythm  MAU COURSE Wells score <2, low probability of PE.   CBC, CMP, EKG, GI cocktail.  No relief of symptoms after GI cocktail. Ibuprofen ordered.  Pain resolved. Reviewed symptoms, exam and labs with Dr. Tamela Oddi. Low suspicion for cardiac etiology for pain or PE. Suspect pain may be musculoskeletal pain from cough.   ASSESSMENT 1. Viral URI with cough   2. Pleuritic chest pain   3. Postpartum edema    PLAN Discharge home in stable condition per consult with Dr. Tamela Oddi. PE and preeclampsia precautions.  Increase fluids and elevate feet.     Follow-up Information   Follow up with [facility] On 09/12/2013. (As scheduled or, As needed, If symptoms worsen)    Contact information:   [address] [place] [address] [phone]      Follow up with [facility] [place] [facility]ERN[facility] AD[facility]. (As needed, If symptoms worsen)    Contact information:   [address] [id number] [place] [address] [phone]       Medication List         ibuprofen 600 MG tablet  Commonly known as:  ADVIL,MOTRIN  Take 1 tablet (600 mg total) by mouth every 6 (six) hours as needed (pain scale < 4).     oxyCODONE-acetaminophen 5-325 MG per tablet  Commonly known as:  PERCOCET/ROXICET  Take 1-2 tablets by mouth every 4 (four) hours as needed for severe pain (moderate - severe pain).     valACYclovir 1000 MG tablet  Commonly known as:  VALTREX  Take 1 tablet po  BID X 5 DAYS, THEN 1PO DAILY FOR SUPPRESSION.       [place], [place] 09/05/2013  3:34 AM

## 2013-09-09 ENCOUNTER — Ambulatory Visit: Payer: Self-pay

## 2013-09-09 NOTE — Lactation Note (Signed)
This note was copied from the chart of Pamela Royston Bakeamara Mehringer. Lactation Consultation Note Follow up consult with this mom of a NICU baby, now 722 weeks old, and 37 5/7 weeks corrected gestation. The baby is small, weighing 4 lbs 14.2 ounces. With a 20 nipple shield, he latched and suckled on and off for about 10 minutes. i placed milk in the shield with a curved tip syringe, which stimulated his sucking. Mom very pleased. I will work more with her and baby this week, and have her apply shield independently.  Mom has been pumping about 6 times a day. I cautioned her that 8 times a day is going to protect her milk supply, and that she can go every 4 hours at night, times 2, and then every 2-3 during the day, her goal being at least 8 times a day.   Patient Name: [name] ZOXWR'UToday's Date: 09/09/2013 Reason for consult: Follow-up assessment;NICU baby   Maternal Data    Feeding Feeding Type: Breast Milk with Formula added Length of feed: 30 min  LATCH Score/Interventions Latch: Repeated attempts needed to sustain latch, nipple held in mouth throughout feeding, stimulation needed to elicit sucking reflex. (flat nipples and large breasts, 20 nipple shiled used with goo result) Intervention(s): Adjust position;Assist with latch  Audible Swallowing: A few with stimulation (due to milk placed in the shiled by syringe)  Type of Nipple: Flat  Comfort (Breast/Nipple): Soft / non-tender     Hold (Positioning): Assistance needed to correctly position infant at breast and maintain latch. Intervention(s): Breastfeeding basics reviewed;Support Pillows;Position options  LATCH Score: 6  Lactation Tools Discussed/Used Tools: Nipple Shields Nipple shield size: 20   Consult Status Consult Status: Follow-up Follow-up type: In-patient (in NICU)    Alfred LevinsChristine Anne Mariya Mottley 09/09/2013, 5:04 PM

## 2013-09-11 ENCOUNTER — Ambulatory Visit: Payer: 59 | Admitting: Obstetrics

## 2013-09-11 ENCOUNTER — Encounter: Payer: Self-pay | Admitting: Obstetrics

## 2013-09-11 ENCOUNTER — Inpatient Hospital Stay (HOSPITAL_COMMUNITY)
Admission: AD | Admit: 2013-09-11 | Discharge: 2013-09-14 | DRG: 776 | Disposition: A | Discharge status: Home / Self Care | Payer: 59 | Source: Ambulatory Visit | Attending: Obstetrics & Gynecology | Admitting: Obstetrics & Gynecology

## 2013-09-11 ENCOUNTER — Inpatient Hospital Stay (HOSPITAL_COMMUNITY): Payer: 59

## 2013-09-11 ENCOUNTER — Ambulatory Visit (INDEPENDENT_AMBULATORY_CARE_PROVIDER_SITE_OTHER): Payer: 59 | Admitting: Obstetrics

## 2013-09-11 ENCOUNTER — Encounter (HOSPITAL_COMMUNITY): Payer: Self-pay | Admitting: *Deleted

## 2013-09-11 DIAGNOSIS — O1410 Severe pre-eclampsia, unspecified trimester: Secondary | ICD-10-CM | POA: Diagnosis present

## 2013-09-11 DIAGNOSIS — J069 Acute upper respiratory infection, unspecified: Secondary | ICD-10-CM

## 2013-09-11 DIAGNOSIS — K219 Gastro-esophageal reflux disease without esophagitis: Secondary | ICD-10-CM | POA: Diagnosis present

## 2013-09-11 DIAGNOSIS — J811 Chronic pulmonary edema: Secondary | ICD-10-CM

## 2013-09-11 DIAGNOSIS — R0602 Shortness of breath: Secondary | ICD-10-CM

## 2013-09-11 DIAGNOSIS — J90 Pleural effusion, not elsewhere classified: Secondary | ICD-10-CM | POA: Diagnosis present

## 2013-09-11 DIAGNOSIS — I059 Rheumatic mitral valve disease, unspecified: Secondary | ICD-10-CM

## 2013-09-11 DIAGNOSIS — O1205 Gestational edema, complicating the puerperium: Secondary | ICD-10-CM

## 2013-09-11 DIAGNOSIS — IMO0002 Reserved for concepts with insufficient information to code with codable children: Secondary | ICD-10-CM

## 2013-09-11 DIAGNOSIS — O1415 Severe pre-eclampsia, complicating the puerperium: Principal | ICD-10-CM | POA: Diagnosis present

## 2013-09-11 LAB — COMPREHENSIVE METABOLIC PANEL
ALBUMIN: 2.7 g/dL — AB (ref 3.5–5.2)
ALT: 20 U/L (ref 0–35)
AST: 27 U/L (ref 0–37)
Alkaline Phosphatase: 76 U/L (ref 39–117)
BILIRUBIN TOTAL: 0.4 mg/dL (ref 0.3–1.2)
BUN: 9 mg/dL (ref 6–23)
CO2: 26 meq/L (ref 19–32)
CREATININE: 0.55 mg/dL (ref 0.50–1.10)
Calcium: 8.7 mg/dL (ref 8.4–10.5)
Chloride: 103 mEq/L (ref 96–112)
Glucose, Bld: 83 mg/dL (ref 70–99)
Potassium: 4.3 mEq/L (ref 3.7–5.3)
Sodium: 140 mEq/L (ref 137–147)
Total Protein: 6.3 g/dL (ref 6.0–8.3)

## 2013-09-11 LAB — CBC
HEMATOCRIT: 32.4 % — AB (ref 36.0–46.0)
Hemoglobin: 10.3 g/dL — ABNORMAL LOW (ref 12.0–15.0)
MCH: 27.8 pg (ref 26.0–34.0)
MCHC: 31.8 g/dL (ref 30.0–36.0)
MCV: 87.6 fL (ref 78.0–100.0)
Platelets: 194 10*3/uL (ref 150–400)
RBC: 3.7 MIL/uL — ABNORMAL LOW (ref 3.87–5.11)
RDW: 16.9 % — AB (ref 11.5–15.5)
WBC: 5.7 10*3/uL (ref 4.0–10.5)

## 2013-09-11 LAB — BLOOD GAS, ARTERIAL
Acid-Base Excess: 0 mmol/L (ref 0.0–2.0)
BICARBONATE: 23.1 meq/L (ref 20.0–24.0)
DRAWN BY: 132
FIO2: 0.21 %
TCO2: 24.2 mmol/L (ref 0–100)
pCO2 arterial: 33.4 mmHg — ABNORMAL LOW (ref 35.0–45.0)
pH, Arterial: 7.455 — ABNORMAL HIGH (ref 7.350–7.450)
pO2, Arterial: 93.9 mmHg (ref 80.0–100.0)

## 2013-09-11 LAB — PRO B NATRIURETIC PEPTIDE: Pro B Natriuretic peptide (BNP): 496.3 pg/mL — ABNORMAL HIGH (ref 0–125)

## 2013-09-11 LAB — URINALYSIS, ROUTINE W REFLEX MICROSCOPIC
BILIRUBIN URINE: NEGATIVE
GLUCOSE, UA: NEGATIVE mg/dL
HGB URINE DIPSTICK: NEGATIVE
Ketones, ur: NEGATIVE mg/dL
Leukocytes, UA: NEGATIVE
Nitrite: NEGATIVE
PH: 8 (ref 5.0–8.0)
Protein, ur: NEGATIVE mg/dL
SPECIFIC GRAVITY, URINE: 1.02 (ref 1.005–1.030)
Urobilinogen, UA: 0.2 mg/dL (ref 0.0–1.0)

## 2013-09-11 LAB — LACTATE DEHYDROGENASE: LDH: 246 U/L (ref 94–250)

## 2013-09-11 LAB — URIC ACID: URIC ACID, SERUM: 3.2 mg/dL (ref 2.4–7.0)

## 2013-09-11 LAB — PROTEIN / CREATININE RATIO, URINE
Creatinine, Urine: 45.61 mg/dL
Protein Creatinine Ratio: 0.15 (ref 0.00–0.15)
Total Protein, Urine: 7 mg/dL

## 2013-09-11 MED ORDER — PRENATAL MULTIVITAMIN CH
1.0000 | ORAL_TABLET | Freq: Every day | ORAL | Status: DC
  Administered 2013-09-12 – 2013-09-14 (×3): 1 via ORAL
  Filled 2013-09-11 (×4): qty 1

## 2013-09-11 MED ORDER — FUROSEMIDE 10 MG/ML IJ SOLN
20.0000 mg | Freq: Once | INTRAMUSCULAR | Status: AC
  Administered 2013-09-11: 20 mg via INTRAVENOUS
  Filled 2013-09-11: qty 2

## 2013-09-11 MED ORDER — OXYCODONE-ACETAMINOPHEN 5-325 MG PO TABS
1.0000 | ORAL_TABLET | ORAL | Status: DC | PRN
  Administered 2013-09-11 – 2013-09-14 (×7): 2 via ORAL
  Filled 2013-09-11 (×7): qty 2

## 2013-09-11 MED ORDER — LABETALOL HCL 200 MG PO TABS
200.0000 mg | ORAL_TABLET | Freq: Three times a day (TID) | ORAL | Status: DC
  Administered 2013-09-11 – 2013-09-14 (×8): 200 mg via ORAL
  Filled 2013-09-11 (×11): qty 1

## 2013-09-11 NOTE — H&P (Signed)
[name] is an 33 y.o. female.   Chief Complaint: shortness of breath HPI: The patient is 2 weeks s/p a SVD.  She noted onset of dyspnea/orthopnea 1 week ago.  She presented to MAU with similar complaint 1 week ago.  An EKG was normal.  She was seen in the office today and was also noted to have elevated blood pressures.  She denies cough, chest pain or fever.  There is no significant h/o cardiac or pulmonary disease.  Past Medical History  Diagnosis Date  . Preterm labor   . GERD (gastroesophageal reflux disease)   . Infection     UTI  . 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 Hyperemesis   . Syphilis 07/20/06    Past Surgical History  Procedure Laterality Date  . Cesarean section    . Breast lumpectomy Right   . Tendon repair Right     Right Hand  . Wisdom tooth extraction      Family History  Problem Relation Age of Onset  . Cancer Maternal Grandmother     breast   Social History:  reports that she has never smoked. She has never used smokeless tobacco. She reports that she does not drink alcohol or use illicit drugs.  Allergies: No Known Allergies  Medications Prior to Admission  Medication Sig Dispense Refill  . ibuprofen (ADVIL,MOTRIN) 600 MG tablet Take 600 mg by mouth 3 (three) times daily as needed (pain scale < 4).      . loratadine (CLARITIN) 10 MG tablet Take 10 mg by mouth daily as needed for allergies.      . Prenatal Vit-Fe Fumarate-FA (PRENATAL MULTIVITAMIN) TABS tablet Take 1 tablet by mouth daily at 12 noon.      . valACYclovir (VALTREX) 1000 MG tablet Take 1 tablet po BID X 5 DAYS, THEN 1PO DAILY FOR SUPPRESSION.  44 tablet  11  . [DISCONTINUED] ibuprofen (ADVIL,MOTRIN) 600 MG tablet Take 1 tablet (600 mg total) by mouth every 6 (six) hours as needed (pain scale < 4).  30 tablet  5    Results for orders placed during the hospital encounter of 09/11/13 (from the past 48 hour(s))  PROTEIN / CREATININE  RATIO, URINE     Status: None   Collection Time    09/11/13  1:10 PM      Result Value Ref Range   Creatinine, Urine 45.61     Total Protein, Urine 7     Comment: NO NORMAL RANGE ESTABLISHED FOR THIS TEST   PROTEIN CREATININE RATIO 0.15  0.00 - 0.15  URINALYSIS, ROUTINE W REFLEX MICROSCOPIC     Status: None   Collection Time    09/11/13  1:10 PM      Result Value Ref Range   Color, Urine YELLOW  YELLOW   APPearance CLEAR  CLEAR   Specific Gravity, Urine 1.020  1.005 - 1.030   pH 8.0  5.0 - 8.0   Glucose, UA NEGATIVE  NEGATIVE mg/dL   Hgb urine dipstick NEGATIVE  NEGATIVE   Bilirubin Urine NEGATIVE  NEGATIVE   Ketones, ur NEGATIVE  NEGATIVE mg/dL   Protein, ur NEGATIVE  NEGATIVE mg/dL   Urobilinogen, UA 0.2  0.0 - 1.0 mg/dL   Nitrite NEGATIVE  NEGATIVE   Leukocytes, UA NEGATIVE  NEGATIVE   Comment: MICROSCOPIC NOT DONE ON URINES WITH NEGATIVE PROTEIN, BLOOD, LEUKOCYTES, NITRITE, OR GLUCOSE <1000 mg/dL.  PRO B NATRIURETIC PEPTIDE  Status: Abnormal   Collection Time    09/11/13  1:31 PM      Result Value Ref Range   Pro B Natriuretic peptide (BNP) 496.3 (*) 0 - 125 pg/mL   Comment: Performed at [facility]  CBC     Status: Abnormal   Collection Time    09/11/13  1:41 PM      Result Value Ref Range   WBC 5.7  4.0 - 10.5 K/uL   RBC 3.70 (*) 3.87 - 5.11 MIL/uL   Hemoglobin 10.3 (*) 12.0 - 15.0 g/dL   HCT 32.4 (*) 36.0 - 46.0 %   MCV 87.6  78.0 - 100.0 fL   MCH 27.8  26.0 - 34.0 pg   MCHC 31.8  30.0 - 36.0 g/dL   RDW 16.9 (*) 11.5 - 15.5 %   Platelets 194  150 - 400 K/uL  COMPREHENSIVE METABOLIC PANEL     Status: Abnormal   Collection Time    09/11/13  1:41 PM      Result Value Ref Range   Sodium 140  137 - 147 mEq/L   Potassium 4.3  3.7 - 5.3 mEq/L   Chloride 103  96 - 112 mEq/L   CO2 26  19 - 32 mEq/L   Glucose, Bld 83  70 - 99 mg/dL   BUN 9  6 - 23 mg/dL   Creatinine, Ser 0.55  0.50 - 1.10 mg/dL   Calcium 8.7  8.4 - 10.5 mg/dL   Total Protein 6.3  6.0 -  8.3 g/dL   Albumin 2.7 (*) 3.5 - 5.2 g/dL   AST 27  0 - 37 U/L   ALT 20  0 - 35 U/L   Alkaline Phosphatase 76  39 - 117 U/L   Total Bilirubin 0.4  0.3 - 1.2 mg/dL   GFR calc non Af Amer >90  >90 mL/min   GFR calc Af Amer >90  >90 mL/min   Comment: (NOTE)     The eGFR has been calculated using the CKD EPI equation.     This calculation has not been validated in all clinical situations.     eGFR's persistently <90 mL/min signify possible Chronic Kidney     Disease.  URIC ACID     Status: None   Collection Time    09/11/13  1:41 PM      Result Value Ref Range   Uric Acid, Serum 3.2  2.4 - 7.0 mg/dL  LACTATE DEHYDROGENASE     Status: None   Collection Time    09/11/13  1:41 PM      Result Value Ref Range   LDH 246  94 - 250 U/L   Dg Chest 2 View  09/11/2013   CLINICAL DATA:  Shortness of breath, nonsmoker, patient is postpartum  EXAM: CHEST  2 VIEW  COMPARISON:  None.  FINDINGS: There is mild bilateral perihilar interstitial thickening. There are trace bilateral pleural effusions. There is no pneumothorax. Normal cardiomediastinal silhouette. Unremarkable osseous structures.  IMPRESSION: Mild bilateral perihilar interstitial thickening and trace bilateral pleural effusions. This appearance can be seen with mild pulmonary edema versus atypical infection. If there is clinical concern regarding cardiomyopathy, evaluation with echocardiography is recommended.   Electronically Signed   By: Hetal  Patel   On: 09/11/2013 15:58    Review of Systems  Constitutional: Negative.   Eyes: Negative for blurred vision.  Respiratory: Positive for shortness of breath. Negative for cough and wheezing.   Cardiovascular: Positive   for orthopnea and leg swelling. Negative for chest pain.  Gastrointestinal: Negative for abdominal pain.  Neurological: Negative.  Negative for headaches.    Blood pressure 152/101, pulse 85, temperature 98.1 F (36.7 C), temperature source Oral, resp. rate 18, weight 94.303 kg  (207 lb 14.4 oz), SpO2 100.00%, currently breastfeeding. Physical Exam  Constitutional: She is oriented to person, place, and time. She appears well-developed and well-nourished. No distress.  HENT:  Head: Normocephalic and atraumatic.  Neck: Neck supple.  Cardiovascular: Normal rate, regular rhythm and normal heart sounds.  Exam reveals no gallop and no friction rub.   No murmur heard. Respiratory: Breath sounds normal. She has no rales.  GI: Soft. There is no tenderness.  Musculoskeletal: She exhibits edema.  Neurological: She is alert and oriented to person, place, and time. She has normal reflexes.     Assessment/Plan Postpartum hypertension Signs/symptoms/labs/imaging worrisome for a possible peripartum cardiomyopathy   Admit  Monitor B/Ps, O2 sats  2D echo/EKG  Cardiology consult  Lahoma Crocker 09/11/2013, 6:08 PM

## 2013-09-11 NOTE — MAU Note (Signed)
Patient states she had a vaginal delivery on 4-6. States she has been having shortness of breath since 4-15 when she was evaluated in MAU. States she was seen for a post partum visit today and her blood pressure was elevated. Has been having headaches for several days. Was sent to MAU for further evaluation.

## 2013-09-11 NOTE — MAU Provider Note (Signed)
History     CSN: [id number]  Arrival date and time: 09/11/13 1230   First Provider Initiated Contact with Patient 09/11/13 1319      Chief Complaint  Patient presents with  . Hypertension  . Headache  . Shortness of Breath   HPI [name] is a 33 y.o. [id number] who is 16 days PP after normal vaginal delivery who was sent to MAU from the office for further evaluation of SOB and elevated BP. The patient denies HTN in pregnancy or prior to pregnancy. She has also had significant LE edema. She denies HA, blurred vision, floaters or abdominal pain today. She states recent SOB that is worse with laying flat. Denies chest pain.   OB History   Grav Para Term Preterm Abortions TAB SAB Ect Mult Living   6 3 1 2 3 3    3       Past Medical History  Diagnosis Date  . Preterm labor   . GERD (gastroesophageal reflux disease)   . Infection     UTI  . 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 Hyperemesis   . Syphilis 07/20/06    Past Surgical History  Procedure Laterality Date  . Cesarean section    . Breast lumpectomy Right   . Tendon repair Right     Right Hand  . Wisdom tooth extraction      Family History  Problem Relation Age of Onset  . Cancer Maternal Grandmother     breast    History  Substance Use Topics  . Smoking status: Never Smoker   . Smokeless tobacco: Never Used  . Alcohol Use: No     Comment: not currently    Allergies: No Known Allergies  Prescriptions prior to admission  Medication Sig Dispense Refill  . ibuprofen (ADVIL,MOTRIN) 600 MG tablet Take 600 mg by mouth 3 (three) times daily as needed (pain scale < 4).      . loratadine (CLARITIN) 10 MG tablet Take 10 mg by mouth daily as needed for allergies.      . Prenatal Vit-Fe Fumarate-FA (PRENATAL MULTIVITAMIN) TABS tablet Take 1 tablet by mouth daily at 12 noon.      . valACYclovir (VALTREX) 1000 MG tablet Take 1 tablet po BID X 5 DAYS, THEN 1PO  DAILY FOR SUPPRESSION.  44 tablet  11  . [DISCONTINUED] ibuprofen (ADVIL,MOTRIN) 600 MG tablet Take 1 tablet (600 mg total) by mouth every 6 (six) hours as needed (pain scale < 4).  30 tablet  5    Review of Systems  Constitutional: Negative for fever and malaise/fatigue.  Eyes: Negative for blurred vision and double vision.  Gastrointestinal: Negative for abdominal pain.  Neurological: Negative for headaches.   Physical Exam   Blood pressure 152/101, pulse 85, temperature 98.1 F (36.7 C), temperature source Oral, resp. rate 18, height 5\' 4"  (1.626 m), weight 94.303 kg (207 lb 14.4 oz), SpO2 100.00%, currently breastfeeding.  Physical Exam  Constitutional: She is oriented to person, place, and time. She appears well-developed and well-nourished. No distress.  HENT:  Head: Normocephalic and atraumatic.  Cardiovascular: Normal rate, regular rhythm, normal heart sounds and intact distal pulses.   Respiratory: Effort normal and breath sounds normal. No respiratory distress.  GI: Soft. Bowel sounds are normal. She exhibits no distension and no mass. There is no tenderness. There is no rebound and no guarding.  Musculoskeletal: She exhibits edema (2+ pitting edema to the  knee bilaterally).  Neurological: She is alert and oriented to person, place, and time. She has normal reflexes.  No clonus  Skin: Skin is warm and dry. No erythema.  Psychiatric: She has a normal mood and affect.   Results for orders placed during the hospital encounter of 09/11/13 (from the past 24 hour(s))  PROTEIN / CREATININE RATIO, URINE     Status: None   Collection Time    09/11/13  1:10 PM      Result Value Ref Range   Creatinine, Urine 45.61     Total Protein, Urine 7     PROTEIN CREATININE RATIO 0.15  0.00 - 0.15  URINALYSIS, ROUTINE W REFLEX MICROSCOPIC     Status: None   Collection Time    09/11/13  1:10 PM      Result Value Ref Range   Color, Urine YELLOW  YELLOW   APPearance CLEAR  CLEAR   Specific  Gravity, Urine 1.020  1.005 - 1.030   pH 8.0  5.0 - 8.0   Glucose, UA NEGATIVE  NEGATIVE mg/dL   Hgb urine dipstick NEGATIVE  NEGATIVE   Bilirubin Urine NEGATIVE  NEGATIVE   Ketones, ur NEGATIVE  NEGATIVE mg/dL   Protein, ur NEGATIVE  NEGATIVE mg/dL   Urobilinogen, UA 0.2  0.0 - 1.0 mg/dL   Nitrite NEGATIVE  NEGATIVE   Leukocytes, UA NEGATIVE  NEGATIVE  PRO B NATRIURETIC PEPTIDE     Status: Abnormal   Collection Time    09/11/13  1:31 PM      Result Value Ref Range   Pro B Natriuretic peptide (BNP) 496.3 (*) 0 - 125 pg/mL  CBC     Status: Abnormal   Collection Time    09/11/13  1:41 PM      Result Value Ref Range   WBC 5.7  4.0 - 10.5 K/uL   RBC 3.70 (*) 3.87 - 5.11 MIL/uL   Hemoglobin 10.3 (*) 12.0 - 15.0 g/dL   HCT 16.1 (*) 09.6 - 04.5 %   MCV 87.6  78.0 - 100.0 fL   MCH 27.8  26.0 - 34.0 pg   MCHC 31.8  30.0 - 36.0 g/dL   RDW 40.9 (*) 81.1 - 91.4 %   Platelets 194  150 - 400 K/uL  COMPREHENSIVE METABOLIC PANEL     Status: Abnormal   Collection Time    09/11/13  1:41 PM      Result Value Ref Range   Sodium 140  137 - 147 mEq/L   Potassium 4.3  3.7 - 5.3 mEq/L   Chloride 103  96 - 112 mEq/L   CO2 26  19 - 32 mEq/L   Glucose, Bld 83  70 - 99 mg/dL   BUN 9  6 - 23 mg/dL   Creatinine, Ser 7.82  0.50 - 1.10 mg/dL   Calcium 8.7  8.4 - 95.6 mg/dL   Total Protein 6.3  6.0 - 8.3 g/dL   Albumin 2.7 (*) 3.5 - 5.2 g/dL   AST 27  0 - 37 U/L   ALT 20  0 - 35 U/L   Alkaline Phosphatase 76  39 - 117 U/L   Total Bilirubin 0.4  0.3 - 1.2 mg/dL   GFR calc non Af Amer >90  >90 mL/min   GFR calc Af Amer >90  >90 mL/min  URIC ACID     Status: None   Collection Time    09/11/13  1:41 PM      Result  Value Ref Range   Uric Acid, Serum 3.2  2.4 - 7.0 mg/dL  LACTATE DEHYDROGENASE     Status: None   Collection Time    09/11/13  1:41 PM      Result Value Ref Range   LDH 246  94 - 250 U/L  BLOOD GAS, ARTERIAL     Status: Abnormal   Collection Time    09/11/13  6:19 PM      Result  Value Ref Range   FIO2 0.21     Delivery systems ROOM AIR     pH, Arterial 7.455 (*) 7.350 - 7.450   pCO2 arterial 33.4 (*) 35.0 - 45.0 mmHg   pO2, Arterial 93.9  80.0 - 100.0 mmHg   Bicarbonate 23.1  20.0 - 24.0 mEq/L   TCO2 24.2  0 - 100 mmol/L   Acid-Base Excess 0.0  0.0 - 2.0 mmol/L   Collection site RADIAL     Drawn by 132     Sample type ARTERIAL     Allens test (pass/fail) PASS  PASS   Patient Vitals for the past 24 hrs:  BP Temp Temp src Pulse Resp SpO2 Height Weight  09/11/13 1739 152/101 mmHg 98.1 F (36.7 C) Oral 85 18 100 % 5\' 4"  (1.626 m) 94.303 kg (207 lb 14.4 oz)  09/11/13 1519 141/91 mmHg - - 81 16 98 % - -  09/11/13 1430 152/98 mmHg - - 79 - 97 % - -  09/11/13 1415 152/96 mmHg - - 80 - 96 % - -  09/11/13 1400 153/95 mmHg - - 76 - 98 % - -  09/11/13 1345 154/100 mmHg - - 86 - - - -  09/11/13 1333 - - - 91 - 99 % - -  09/11/13 1330 151/103 mmHg - - 81 - - - -  09/11/13 1315 150/93 mmHg - - 87 - - - -  09/11/13 1304 146/93 mmHg 98.5 F (36.9 C) - 86 16 97 % - 96.798 kg (213 lb 6.4 oz)    MAU Course  Procedures None  MDM UA, CBC, CMP, Urine prot/creatinine ratio, uric acid and LDH today Discussed patient and initial labs with Dr. Tamela OddiJackson-Moore Recommends order chest xray and BNP Discussed lab results and patient VS with Dr. Tamela OddiJackson-Moore Admit for observation. May consult medicine for elevated BNP and abnormal CXR results. Possible further work-up in the morning Continue to monitor VS tonight including pulse oximetry Assessment and Plan  A: SOB Elevated BP  P: Admit to womens unit for observation overnight  Freddi StarrJulie N Ethier, PA-C  09/11/2013, 9:15 PM

## 2013-09-11 NOTE — Progress Notes (Signed)
Echocardiogram 2D Echocardiogram has been performed.  Genene ChurnJames M Blaiden Werth 09/11/2013, 8:44 PM

## 2013-09-12 ENCOUNTER — Ambulatory Visit: Payer: 59 | Admitting: Obstetrics

## 2013-09-12 DIAGNOSIS — IMO0002 Reserved for concepts with insufficient information to code with codable children: Secondary | ICD-10-CM | POA: Insufficient documentation

## 2013-09-12 DIAGNOSIS — O1410 Severe pre-eclampsia, unspecified trimester: Secondary | ICD-10-CM | POA: Diagnosis present

## 2013-09-12 DIAGNOSIS — O1205 Gestational edema, complicating the puerperium: Secondary | ICD-10-CM | POA: Insufficient documentation

## 2013-09-12 LAB — CBC
HCT: 29.4 % — ABNORMAL LOW (ref 36.0–46.0)
Hemoglobin: 9.5 g/dL — ABNORMAL LOW (ref 12.0–15.0)
MCH: 28 pg (ref 26.0–34.0)
MCHC: 32.3 g/dL (ref 30.0–36.0)
MCV: 86.7 fL (ref 78.0–100.0)
Platelets: 198 10*3/uL (ref 150–400)
RBC: 3.39 MIL/uL — ABNORMAL LOW (ref 3.87–5.11)
RDW: 16.9 % — AB (ref 11.5–15.5)
WBC: 5.6 10*3/uL (ref 4.0–10.5)

## 2013-09-12 LAB — COMPREHENSIVE METABOLIC PANEL
ALK PHOS: 72 U/L (ref 39–117)
ALT: 19 U/L (ref 0–35)
AST: 26 U/L (ref 0–37)
Albumin: 2.6 g/dL — ABNORMAL LOW (ref 3.5–5.2)
BILIRUBIN TOTAL: 0.3 mg/dL (ref 0.3–1.2)
BUN: 9 mg/dL (ref 6–23)
CHLORIDE: 102 meq/L (ref 96–112)
CO2: 26 mEq/L (ref 19–32)
Calcium: 8.4 mg/dL (ref 8.4–10.5)
Creatinine, Ser: 0.59 mg/dL (ref 0.50–1.10)
GFR calc Af Amer: >90 mL/min (ref >90)
Glucose, Bld: 104 mg/dL — ABNORMAL HIGH (ref 70–99)
Potassium: 4.2 mEq/L (ref 3.7–5.3)
Sodium: 139 mEq/L (ref 137–147)
Total Protein: 6 g/dL (ref 6.0–8.3)

## 2013-09-12 LAB — LACTATE DEHYDROGENASE: LDH: 220 U/L (ref 94–250)

## 2013-09-12 LAB — PRO B NATRIURETIC PEPTIDE: Pro B Natriuretic peptide (BNP): 377.7 pg/mL — ABNORMAL HIGH (ref 0–125)

## 2013-09-12 MED ORDER — LACTATED RINGERS IV SOLN: INTRAVENOUS | Status: DC

## 2013-09-12 MED ORDER — FUROSEMIDE 10 MG/ML IJ SOLN
20.0000 mg | Freq: Once | INTRAMUSCULAR | Status: AC
  Administered 2013-09-13: 20 mg via INTRAVENOUS
  Filled 2013-09-12: qty 2

## 2013-09-12 MED ORDER — FUROSEMIDE 10 MG/ML IJ SOLN
20.0000 mg | Freq: Once | INTRAMUSCULAR | Status: DC
  Filled 2013-09-12: qty 2

## 2013-09-12 MED ORDER — MAGNESIUM SULFATE BOLUS VIA INFUSION: 4.0000 g | Freq: Once | INTRAVENOUS | Status: DC

## 2013-09-12 MED ORDER — MAGNESIUM SULFATE 40 G IN LACTATED RINGERS - SIMPLE: 2.0000 g/h | INTRAVENOUS | Status: DC

## 2013-09-12 NOTE — Progress Notes (Signed)
Subjective: Patient reports less SOB.   Objective: I have reviewed patient's vital signs, intake and output, medications, labs and radiology results.  General: alert and no distress Resp: clear to auscultation bilaterally Cardio: regular rate and rhythm, S1, S2 normal, no murmur, click, rub or gallop Extremities: edema of lower extremity.   Assessment/Plan: Postpartum HTN.  Stable.  R/O cardiomyopathy.  Cardiac ECHO results pending.  Will get Cardiology consult.   LOS: 1 day    Brock BadCharles A Jediah Horger 09/12/2013, 9:58 AM

## 2013-09-12 NOTE — Progress Notes (Signed)
Results of testing including 2-D echo reviewed with the pt and her mother.  A/P Active Problems:   Pulmonary edema   Severe preeclampsia  Recommend MgSO4 for seizure prophylaxis--declined by the pt; accepts the risk Continue diuresis with lasix

## 2013-09-12 NOTE — Progress Notes (Signed)
09/12/13 1600  Clinical Encounter Type  Visited With Patient;Health care provider Luisa Dago(Tanya Corbitt, RN and Delorse Lekhris Galloway, RN)  Visit Type Spiritual support;Social support  Referral From Nurse  Spiritual Encounters  Spiritual Needs Emotional  Stress Factors  Patient Stress Factors Health changes;Lack of knowledge   Referred by RN because pt was anxious about possible transfer to AICU.  Nurses provided printouts with information about Ms Sanks's diagnoses, encouraging her to write down questions for Dr Tamela OddiJackson-Moore, who plans to visit at ca 6pm; worked alongside nurses to help pt identify and clarify her questions, providing pastoral presence, logistical support, and emotional support to help reduce anxiety and increase self-advocacy and understanding.  Ms Kathreen Cosierinsley was very appreciative.  [facility] will follow pt and her baby in NICU for further support, but please also page as needs arise.  Thank you.  [address]Chaplain Sabatino Williard [place], [place] [phone]

## 2013-09-12 NOTE — Progress Notes (Signed)
Subjective:     [name] is a 33 y.o. female who presents for a postpartum visit. She is 2 weeks postpartum following a spontaneous vaginal delivery. I have fully reviewed the prenatal and intrapartum course. The delivery was at 35  gestational weeks. Outcome: spontaneous vaginal delivery. Anesthesia: epidural. Postpartum course has been normal. Baby's course has been normal in NICU. Baby is feeding by breast. Bleeding thin lochia. Bowel function is normal. Bladder function is normal. Patient is not sexually active. Contraception method is abstinence. Postpartum depression screening: negative.  The following portions of the patient's history were reviewed and updated as appropriate: allergies, current medications, past family history, past medical history, past social history, past surgical history and problem list.  Review of Systems Constitutional: positive for anxiety Respiratory: positive for SOB  MSK:  LE edema  Objective:    BP 147/100  Pulse 96  Temp(Src) 98.1 F (36.7 C)  Ht 5\' 4"  (1.626 m)  Wt 215 lb (97.523 kg)  BMI 36.89 kg/m2  Breastfeeding? Yes  General:  alert and no distress   Breasts:  inspection negative, no nipple discharge or bleeding, no masses or nodularity palpable  Lungs: clear to auscultation bilaterally  Heart:  regular rate and rhythm, S1, S2 normal, no murmur, click, rub or gallop  Abdomen: normal findings: soft, non-tender LE:  Nonpitting edema    Assessment:    Postpartum, 2 weeks.  HTN and SOB and LE edema.  Concern for peripartum cardiomyopathy.  Plan:    1. Contraception: abstinence 2. [facility] further evaluation of HTN, SOB and LE edema. 3. Follow up in: 4 weeks or as needed.

## 2013-09-13 LAB — TROPONIN I: Troponin I: <0.3 ng/mL (ref <0.30)

## 2013-09-13 LAB — BASIC METABOLIC PANEL
BUN: 8 mg/dL (ref 6–23)
CHLORIDE: 100 meq/L (ref 96–112)
CO2: 26 mEq/L (ref 19–32)
CREATININE: 0.61 mg/dL (ref 0.50–1.10)
Calcium: 9.1 mg/dL (ref 8.4–10.5)
GFR calc Af Amer: >90 mL/min (ref >90)
GFR calc non Af Amer: >90 mL/min (ref >90)
Glucose, Bld: 97 mg/dL (ref 70–99)
Potassium: 4.3 mEq/L (ref 3.7–5.3)
Sodium: 138 mEq/L (ref 137–147)

## 2013-09-13 LAB — PRO B NATRIURETIC PEPTIDE: PRO B NATRI PEPTIDE: 303.6 pg/mL — AB (ref 0–125)

## 2013-09-13 NOTE — Progress Notes (Signed)
Post Partum Day Patient over 2 weeks postpartum, readmitted for pulmonary edema and rule out cardiomyopathy.  Subjective: Patient reports her SOB has improved greatly. States she is not 100% but is gradually improving.  Denies HA, RUQ pain or current vision changes.  Objective: Blood pressure 125/77, pulse 96, temperature 97.8 F (36.6 C), temperature source Oral, resp. rate 18, height 5\' 4"  (1.626 m), weight 205 lb (92.987 kg), SpO2 96.00%, currently breastfeeding.  Physical Exam:  General: alert and cooperative Lochia: appropriate Uterine Fundus: firm Incision: NA DVT Evaluation: No evidence of DVT seen on physical exam. Physical Examination: Chest - clear to auscultation, no wheezes, rales or rhonchi, symmetric air entry Heart - normal rate, regular rhythm, normal S1, S2, no murmurs, rubs, clicks or gallops   Recent Labs  09/11/13 1341 09/12/13 0550  HGB 10.3* 9.5*  HCT 32.4* 29.4*    Assessment/Plan: Plan for discharge over the weekend. Consulted w/ MD Clearance CootsHarper pertaining to patient and plan of care. MD Clearance CootsHarper did go to speak and evaluate patient.  Continue medication treatment at this time. Current facility-administered medications:furosemide (LASIX) injection 20 mg, 20 mg, Intravenous, Once, Antionette CharLisa Jackson-Moore, MD;  labetalol (NORMODYNE) tablet 200 mg, 200 mg, Oral, TID, Kathreen CosierBernard A Marshall, MD, 200 mg at 09/12/13 2211;  oxyCODONE-acetaminophen (PERCOCET/ROXICET) 5-325 MG per tablet 1-2 tablet, 1-2 tablet, Oral, Q4H PRN, Kathreen CosierBernard A Marshall, MD, 2 tablet at 09/13/13 0207 prenatal multivitamin tablet 1 tablet, 1 tablet, Oral, Q1200, Freddi StarrJulie N Ethier, PA-C, 1 tablet at 09/12/13 1208    LOS: 2 days   [name] Dessa PhiHowell Primo Innis 09/13/2013, 9:05 AM

## 2013-09-14 MED ORDER — LABETALOL HCL 200 MG PO TABS: 200.0000 mg | ORAL_TABLET | Freq: Three times a day (TID) | ORAL | Status: AC

## 2013-09-14 MED ORDER — CITRANATAL HARMONY 27-1-260 MG PO CAPS: 1.0000 | ORAL_CAPSULE | Freq: Every day | ORAL | Status: AC

## 2013-09-14 MED ORDER — HYDROCORTISONE ACETATE 25 MG RE SUPP: 25.0000 mg | Freq: Two times a day (BID) | RECTAL | Status: AC

## 2013-09-14 NOTE — Progress Notes (Addendum)
Post Partum Day 19 Subjective: no complaints, up ad lib and no significant SOB.  Objective: Blood pressure 133/87, pulse 103, temperature 98 F (36.7 C), temperature source Oral, resp. rate 20, height 5\' 4"  (1.626 m), weight 204 lb (92.534 kg), SpO2 97.00%, currently breastfeeding.  Physical Exam:  General: alert and no distress HEENT:  Negative Lungs:  Clear Heart:  RRR.  No rubs or gallops. Legs:  No edema Neuro:  Negative  Recent Labs  09/11/13 1341 09/12/13 0550  HGB 10.3* 9.5*  HCT 32.4* 29.4*    Assessment/Plan: Admitted with HTN,SOB, LE edema,  Findings were c/w pulmonary edema on CXR.  Cardiac ECHO showed no evidence of cardiomyopathy.  Responded well to therapy.  Ready for discharge. Discharge home.    F/U in office in 1 week.  Continue Labetalol for 1 week, then reevaluate BP.   LOS: 3 days   Brock BadCharles A Miia Blanks 09/14/2013, 8:58 AM

## 2013-09-14 NOTE — Progress Notes (Signed)
Discharge instructions provided to patient at bedside.  Medications, activity, follow up appointments, when to call the doctor and community resources discussed.  No questions at this time.  Patient left unit in stable condition with all personal belongings accompanied by staff.  Osvaldo AngstK. Zanai Mallari, RN----------------------------

## 2013-09-14 NOTE — Discharge Summary (Signed)
Physician Discharge Summary  Patient ID: [name] MRN: [id number] DOB/AGE: 01/19/1981 32 y.o.  Admit date: 09/11/2013 Discharge date: 09/14/2013  Admission Diagnoses:  Postpartum Pulmonary Edema                                          R/O Cardiomyopathy  Discharge Diagnoses:  Same                                          Cardiomyopathy ruled out Active Problems:   Pulmonary edema   Severe preeclampsia   Discharged Condition: good  Hospital Course: Admitted with HTN, SOB and LE edema.  CXR revealed pulmonary edema.  Started on magnesium sulfate, Labetalol and Lasix.  Cardiac ECHO was negative.  Responded well to therapy.  Discharged home on HD#3 in good condition.  Consults: cardiology  Significant Diagnostic Studies: radiology: CXR: pulmonary edema and cardiac ECHO which was negative.  Treatments: cardiac meds: labetolol and furosemide and magnesium sulfate seizure prophylaxis.  Discharge Exam: Blood pressure 133/87, pulse 103, temperature 98 F (36.7 C), temperature source Oral, resp. rate 20, height 5\' 4"  (1.626 m), weight 204 lb (92.534 kg), SpO2 97.00%, currently breastfeeding. General appearance: alert and no distress Resp: clear to auscultation bilaterally Cardio: regular rate and rhythm, S1, S2 normal, no murmur, click, rub or gallop Extremities: extremities normal, atraumatic, no cyanosis or edema Pulses: 2+ and symmetric Neurologic: Grossly normal  Disposition: 01-Home or Self Care  Discharge Orders   Future Appointments Provider Department Dept Phone   09/25/2013 1:30 PM Brock Badharles A Sujata Maines, MD [facility] [phone]   Future Orders Complete By Expires   Call MD for:  difficulty breathing, headache or visual disturbances  As directed    Call MD for:  extreme fatigue  As directed    Call MD for:  hives  As directed    Call MD for:  persistant dizziness or light-headedness  As directed    Call MD for:  persistant nausea and vomiting  As directed    Call MD for:  redness, tenderness, or signs of infection (pain, swelling, redness, odor or green/yellow discharge around incision site)  As directed    Call MD for:  severe uncontrolled pain  As directed    Call MD for:  temperature >100.4  As directed    Call MD for:  As directed    Diet - low sodium heart healthy  As directed        Medication List         ibuprofen 600 MG tablet  Commonly known as:  ADVIL,MOTRIN  Take 600 mg by mouth 3 (three) times daily as needed (pain scale < 4).     labetalol 200 MG tablet  Commonly known as:  NORMODYNE  Take 1 tablet (200 mg total) by mouth 3 (three) times daily.     loratadine 10 MG tablet  Commonly known as:  CLARITIN  Take 10 mg by mouth daily as needed for allergies.     prenatal multivitamin Tabs tablet  Take 1 tablet by mouth daily at 12 noon.     valACYclovir 1000 MG tablet  Commonly known as:  VALTREX  Take 1 tablet po BID X 5 DAYS, THEN 1PO DAILY FOR SUPPRESSION.  Follow-up Information   Follow up with Curry Seefeldt A, MD In 1 week.   Specialty:  Obstetrics and Gynecology   Contact information:   [address] [place] [address] [phone]       Signed: Brock BadCharles A Daron Breeding 09/14/2013, 9:21 AM

## 2013-09-25 ENCOUNTER — Ambulatory Visit (INDEPENDENT_AMBULATORY_CARE_PROVIDER_SITE_OTHER): Payer: 59 | Admitting: Obstetrics

## 2013-09-25 ENCOUNTER — Encounter: Payer: Self-pay | Admitting: Obstetrics

## 2013-09-25 DIAGNOSIS — Z3009 Encounter for other general counseling and advice on contraception: Secondary | ICD-10-CM

## 2013-09-25 MED ORDER — NORETHINDRONE 0.35 MG PO TABS: 1.0000 | ORAL_TABLET | Freq: Every day | ORAL | Status: AC

## 2013-09-25 NOTE — Progress Notes (Signed)
Subjective:     [name] is a 33 y.o. female who presents for a postpartum visit. She is 4 weeks postpartum following a spontaneous vaginal delivery. I have fully reviewed the prenatal and intrapartum course. The delivery was at 35 gestational weeks. Outcome: spontaneous vaginal delivery. Anesthesia: none. Postpartum course has been normal. Baby's course has been normal. Baby is feeding by both breast and bottle - Similac Neosure. Bleeding thin lochia. Bowel function is normal. Bladder function is normal. Patient is not sexually active. Contraception method is oral progesterone-only contraceptive. Postpartum depression screening: negative.  The following portions of the patient's history were reviewed and updated as appropriate: allergies, current medications, past family history, past medical history, past social history, past surgical history and problem list.  Review of Systems A comprehensive review of systems was negative.   Objective:    BP 119/86  Pulse 83  Temp(Src) 98.1 F (36.7 C)  Ht 5\' 4"  (1.626 m)  Wt 200 lb (90.719 kg)  BMI 34.31 kg/m2  Breastfeeding? Yes  General:  alert and no distress PE:  Deferred      Assessment:    4 weeks postpartum.  Doing well.  Plan:    1. Contraception: oral progesterone-only contraceptive 2. Micronor Rx 3. Follow up in: 4 weeks or as needed.

## 2014-03-20 ENCOUNTER — Ambulatory Visit (INDEPENDENT_AMBULATORY_CARE_PROVIDER_SITE_OTHER): Payer: 59 | Admitting: Obstetrics

## 2014-03-21 NOTE — Progress Notes (Signed)
Patient not seen by physician.  Visit cancelled.  Pamela Ceoharles Joannah Gitlin MD

## 2014-03-24 ENCOUNTER — Encounter: Payer: Self-pay | Admitting: Obstetrics

## 2014-05-19 ENCOUNTER — Encounter: Payer: Self-pay | Admitting: *Deleted

## 2015-06-27 IMAGING — US US UA CORD DOPPLER
2 series · 12 of 28 positions shown · non-contrast
Comparison: none

[Series 1: us ob comp +14 wk · 60 acquisitions, 9 frames shown (1 of 2)]
[im 3/60]
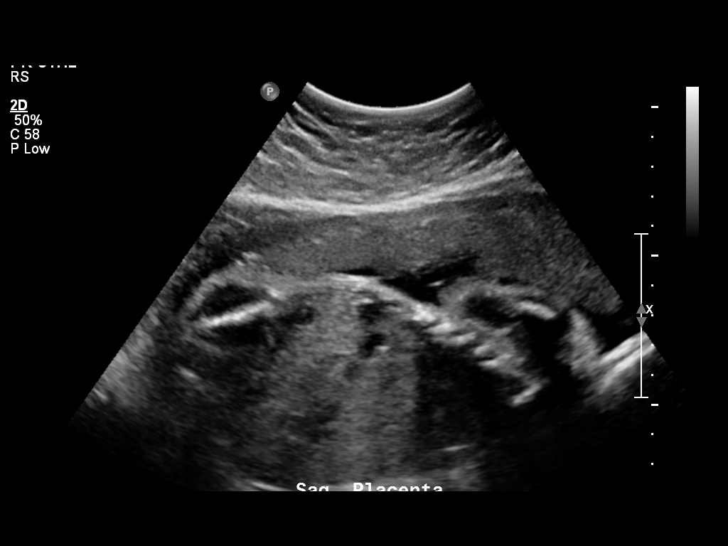
[im 9/60]
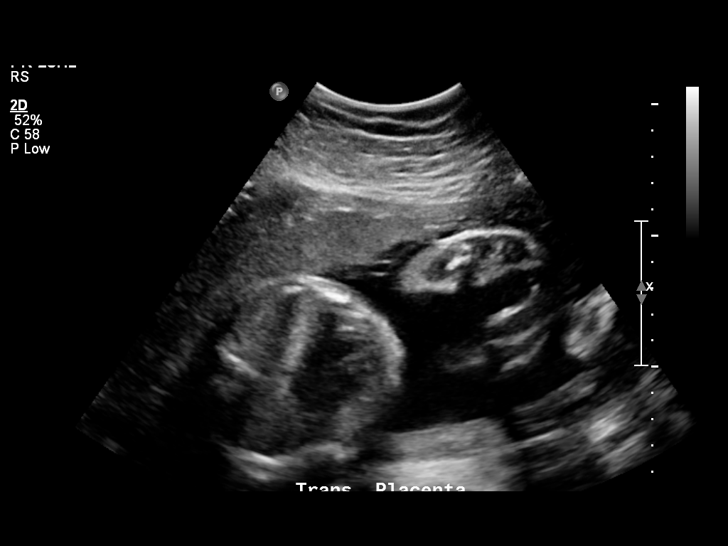
[im 15/60]
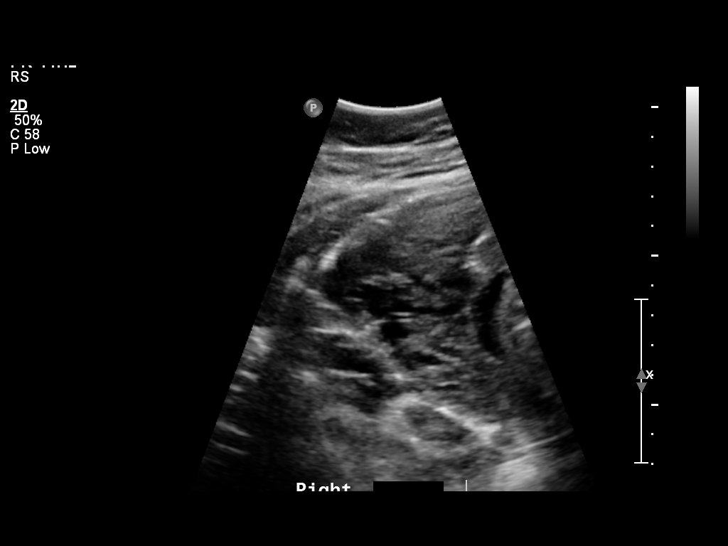
[im 23/60]
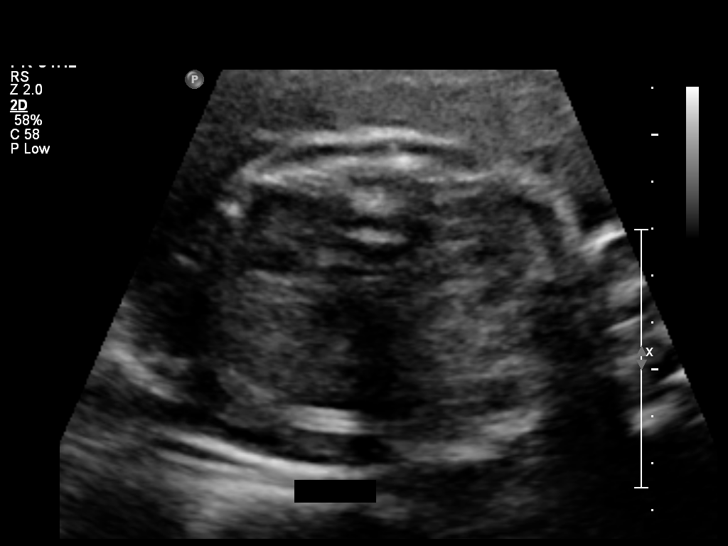
[im 29/60]
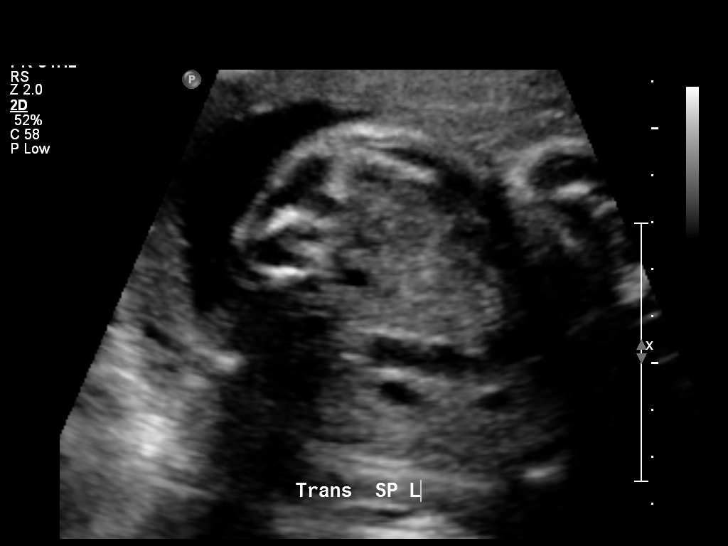
[im 34/60]
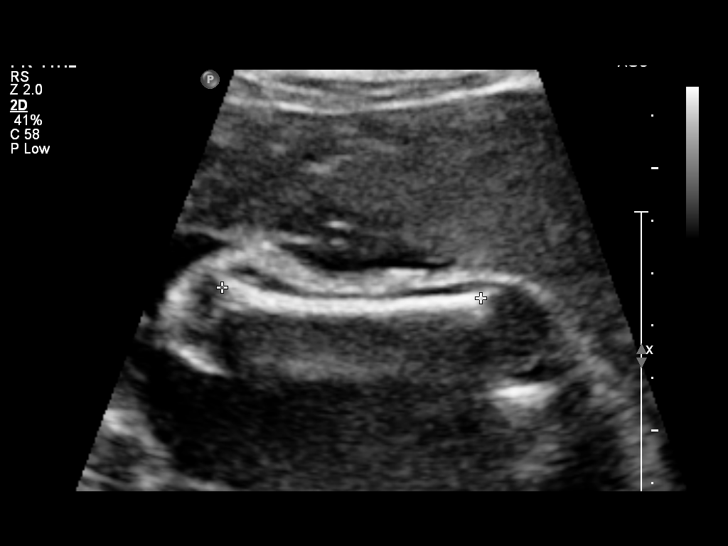
[im 43/60]
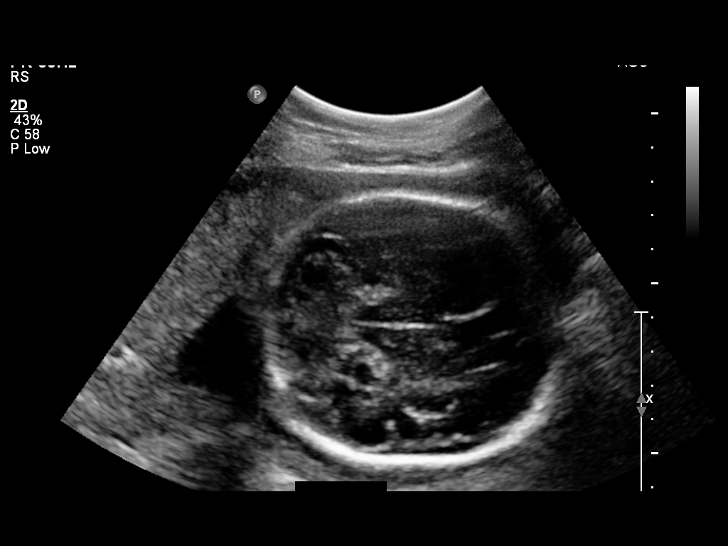
[im 48/60]
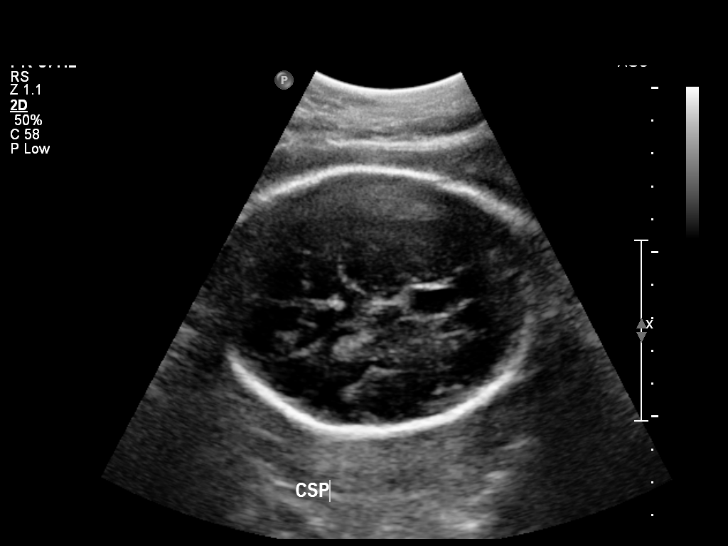
[im 54/60]
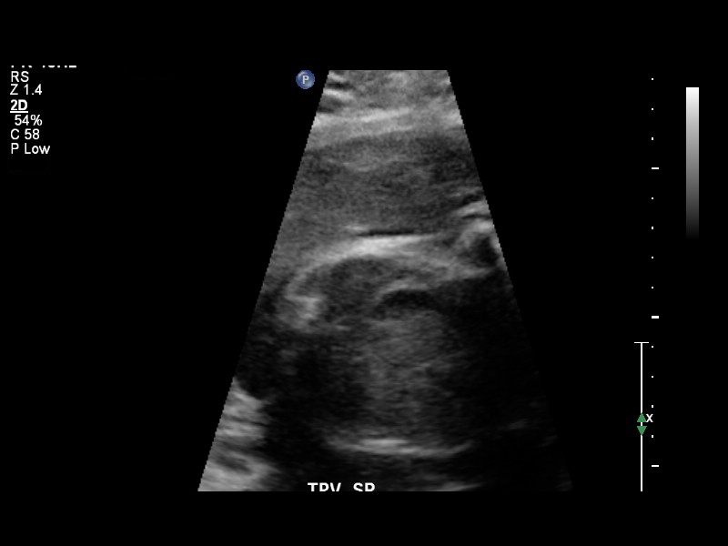

[Series 1: us ob comp +14 wk · 3 of 16 slices shown (2 of 2)]
[im 1/16]
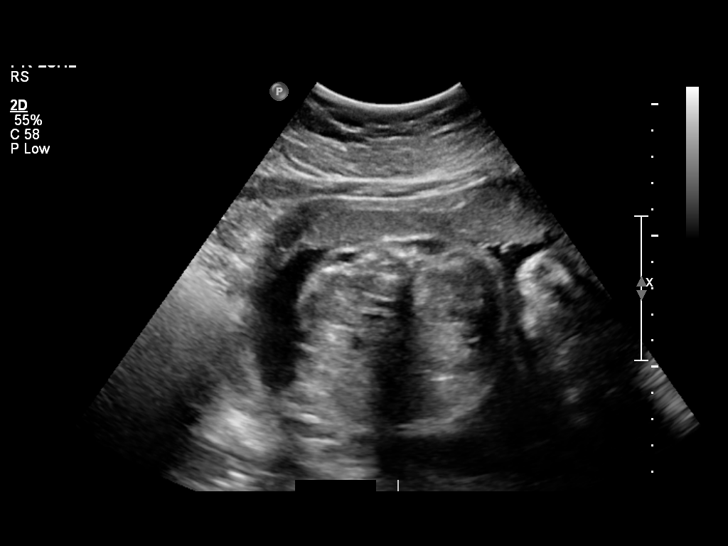
[im 7/16]
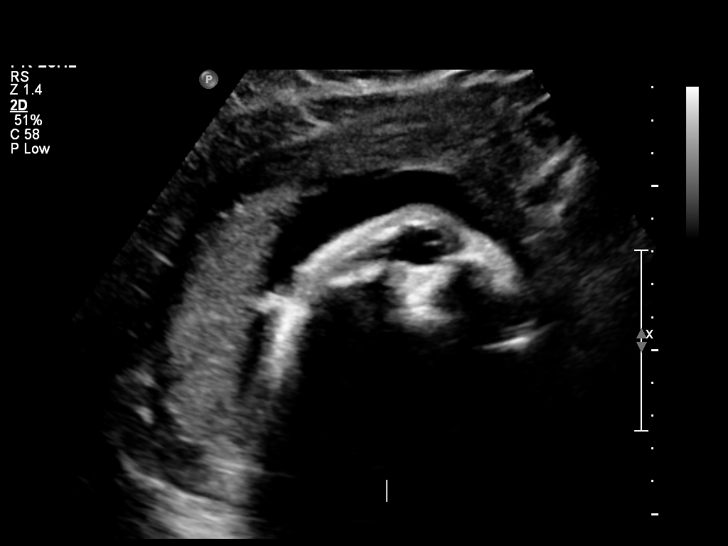
[im 13/16]
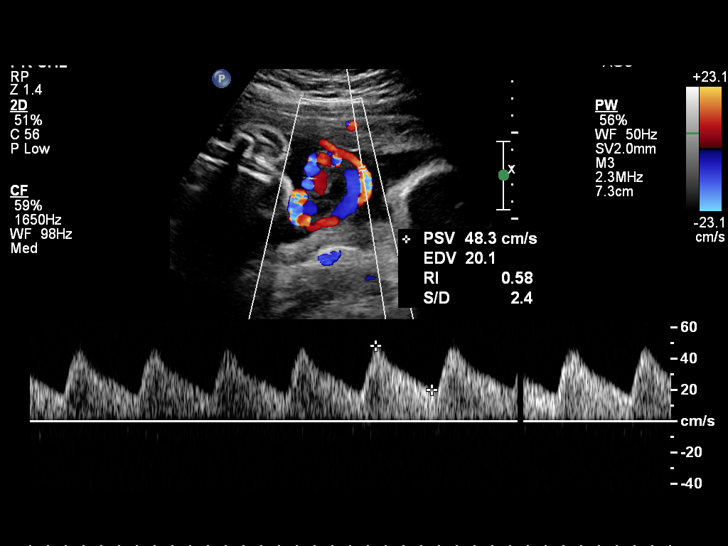

[12 of 28 positions shown; findings below may reference images not displayed]

OBSTETRICS REPORT
                      (Signed Final 07/24/2013 [DATE])

Service(s) Provided

 US OB COMP + 14 WK                                    76805.1
 US UA CORD DOPPLER                                    76820.0
Indications

 Basic anatomic survey
Fetal Evaluation

 Num Of Fetuses:    1
 Fetal Heart Rate:  150                          bpm
 Cardiac Activity:  Observed
 Presentation:      Cephalic
 Placenta:          Anterior, above cervical os
 P. Cord            Visualized, central
 Insertion:

 Amniotic Fluid
 AFI FV:      Subjectively within normal limits
 AFI Sum:     12.69   cm       36  %Tile     Larg Pckt:    4.88  cm
 RUQ:   2.48    cm   RLQ:    2.06   cm    LUQ:   4.88    cm   LLQ:    3.27   cm
Biophysical Evaluation

 Amniotic F.V:   Pocket => 2 cm two         F. Tone:        Observed
                 planes
 F. Movement:    Observed                   Score:          [DATE]
 F. Breathing:   Observed
Biometry

 BPD:     77.7  mm     G. Age:  31w 1d                CI:        72.39   70 - 86
                                                      FL/HC:      19.4   19.3 -

 HC:     290.5  mm     G. Age:  32w 0d       41  %    HC/AC:      1.17   0.96 -

 AC:     249.3  mm     G. Age:  29w 1d        7  %    FL/BPD:     72.5   71 - 87
 FL:      56.3  mm     G. Age:  29w 4d        8  %    FL/AC:      22.6   20 - 24
 HUM:     49.9  mm     G. Age:  29w 2d       15  %

 Est. FW:    7117  gm      3 lb 3 oz     29  %
Gestational Age
 LMP:           31w 0d        Date:  12/19/12                 EDD:   09/25/13
 U/S Today:     30w 3d                                        EDD:   09/29/13
 Best:          31w 0d     Det. By:  LMP  (12/19/12)          EDD:   09/25/13
Anatomy

 Cranium:          Appears normal         Aortic Arch:      Not well visualized
 Fetal Cavum:      Appears normal         Ductal Arch:      Not well visualized
 Ventricles:       Appears normal         Diaphragm:        Not well visualized
 Choroid Plexus:   Appears normal         Stomach:          Appears normal
 Cerebellum:       Appears normal         Abdomen:          Appears normal
 Posterior Fossa:  Appears normal         Abdominal Wall:   Not well visualized
 Nuchal Fold:      Not applicable (>20    Cord Vessels:     Appears normal (3
                   wks GA)                                  vessel cord)
 Face:             Appears normal         Kidneys:          Appear normal
                   (orbits and profile)
 Lips:             Appears normal         Bladder:          Appears normal
 Heart:            Not well visualized    Spine:            Appears normal
 RVOT:             Not well visualized    Lower             Appears normal
                                          Extremities:
 LVOT:             Appears normal         Upper             Appears normal
                                          Extremities:

 Other:  Fetus appears to be a male. Technically difficult due to maternal
         habitus and fetal position.
Doppler - Fetal Vessels

 Umbilical Artery
 S/D:   2.8            52  %tile
 Umbilical Artery
 Absent DFV:    No     Reverse DFV:    No

Cervix Uterus Adnexa

 Cervix:       Not visualized (advanced GA >63wks)
 Uterus:       No abnormality visualized.
 Cul De Sac:   No free fluid seen.
 Left Ovary:    Not visualized.
 Right Ovary:   Not visualized.

 Adnexa:     No abnormality visualized.
Impression

 SIUP at 31+0 weeks
 Normal detailed fetal anatomy; limited views of heart
 Normal amniotic fluid volume
 Measurements consistent with LMP dating; EFW at the 29th
 %tile; AC at the 7th %tile
 UA dopplers were normal for this GA
 BPP [DATE]
Recommendations

 Follow-up ultrasound for growth and to complete the
 anatomic survey in 3 weeks
 questions or concerns.
